# Patient Record
Sex: Male | Born: 1950 | Race: White | Hispanic: No | Smoking: Current every day smoker
Health system: Southern US, Community
[De-identification: ages and names within clinical notes are randomized; demographics above are authoritative.]

## PROBLEM LIST (undated history)

## (undated) DIAGNOSIS — I639 Cerebral infarction, unspecified: Secondary | ICD-10-CM

## (undated) DIAGNOSIS — R7303 Prediabetes: Secondary | ICD-10-CM

## (undated) DIAGNOSIS — I10 Essential (primary) hypertension: Secondary | ICD-10-CM

## (undated) DIAGNOSIS — E041 Nontoxic single thyroid nodule: Secondary | ICD-10-CM

## (undated) DIAGNOSIS — J449 Chronic obstructive pulmonary disease, unspecified: Secondary | ICD-10-CM

## (undated) DIAGNOSIS — I5189 Other ill-defined heart diseases: Secondary | ICD-10-CM

## (undated) HISTORY — DX: Cerebral infarction, unspecified: I63.9

## (undated) HISTORY — DX: Nontoxic single thyroid nodule: E04.1

## (undated) HISTORY — PX: NOSE SURGERY: SHX723

## (undated) HISTORY — PX: HAND SURGERY: SHX662

## (undated) HISTORY — PX: VASECTOMY: SHX75

## (undated) HISTORY — PX: APPENDECTOMY: SHX54

## (undated) HISTORY — DX: Essential (primary) hypertension: I10

## (undated) HISTORY — DX: Chronic obstructive pulmonary disease, unspecified: J44.9

## (undated) HISTORY — DX: Prediabetes: R73.03

## (undated) HISTORY — DX: Other ill-defined heart diseases: I51.89

---

## 2010-06-21 ENCOUNTER — Inpatient Hospital Stay (INDEPENDENT_AMBULATORY_CARE_PROVIDER_SITE_OTHER)
Admission: RE | Admit: 2010-06-21 | Discharge: 2010-06-21 | Disposition: A | Discharge status: Home / Self Care | Payer: PRIVATE HEALTH INSURANCE | Source: Ambulatory Visit | Attending: Family Medicine | Admitting: Family Medicine

## 2010-06-21 ENCOUNTER — Encounter: Payer: Self-pay | Admitting: Family Medicine

## 2010-06-21 DIAGNOSIS — B029 Zoster without complications: Secondary | ICD-10-CM

## 2010-06-23 ENCOUNTER — Telehealth (INDEPENDENT_AMBULATORY_CARE_PROVIDER_SITE_OTHER): Payer: Self-pay | Admitting: Emergency Medicine

## 2011-02-10 NOTE — Telephone Encounter (Signed)
  Phone Note Outgoing Call Call back at [facility] Phone [phone] [facility]     Call placed by: Emilio Math,  June 23, 2010 2:49 PM Call placed to: Patient Summary of Call: Still blistered, not much change, pain has improved.

## 2011-02-10 NOTE — Progress Notes (Signed)
Summary: POSSIBLE INSECT BITE? Room 5   Vital Signs:  Patient Profile:   60 Years Old Male CC:      Painful, red rash on left fingers, under left arm x 3 days Height:     66 inches Weight:      137 pounds O2 Sat:      100 % O2 treatment:    Room Air Temp:     97.4 degrees F oral Pulse rate:   65 / minute Pulse rhythm:   regular Resp:     16 per minute BP sitting:   134 / 76  (left arm) Cuff size:   regular  Vitals Entered By: Emilio Math (June 21, 2010 9:05 AM)                  Current Allergies: No known allergies History of Present Illness Chief Complaint: Painful, red rash on left fingers, under left arm x 3 days History of Present Illness:  Subjective:  Patient complains of onset of a painful rash on the tip of his left second finger 3 days ago, followed by several smaller lesions on left upper arm.  His whole arm has been somewhat sore, and he has now noticed a tender "knot" in his left axilla.  He had chills last night.  No cat or tick bites.  He had chicken pox as a child.  REVIEW OF SYSTEMS Constitutional Symptoms      Denies fever, chills, night sweats, weight loss, weight gain, and fatigue.  Eyes       Denies change in vision, eye pain, eye discharge, glasses, contact lenses, and eye surgery. Ear/Nose/Throat/Mouth       Denies hearing loss/aids, change in hearing, ear pain, ear discharge, dizziness, frequent runny nose, frequent nose bleeds, sinus problems, sore throat, hoarseness, and tooth pain or bleeding.  Respiratory       Denies dry cough, productive cough, wheezing, shortness of breath, asthma, bronchitis, and emphysema/COPD.  Cardiovascular       Denies murmurs, chest pain, and tires easily with exhertion.    Gastrointestinal       Denies stomach pain, nausea/vomiting, diarrhea, constipation, blood in bowel movements, and indigestion. Genitourniary       Denies painful urination, kidney stones, and loss of urinary control. Neurological  Denies paralysis, seizures, and fainting/blackouts. Musculoskeletal       Denies muscle pain, joint pain, joint stiffness, decreased range of motion, redness, swelling, muscle weakness, and gout.  Skin       Denies bruising, unusual mles/lumps or sores, and hair/skin or nail changes.  Psych       Denies mood changes, temper/anger issues, anxiety/stress, speech problems, depression, and sleep problems.  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  No swelling or tenderness.  In the web space at base of second finger is a small patch of erythema.  On the left upper arm over biceps area are several small 1cm crops of erythematous herpetiform lesions.  In the left axilla is a tender 2cm dia  axillary node. Eyes:  Pupils are equal, round, and reactive to light and  accomdation.  Extraocular movement is intact.  Conjunctivae are not inflamed.  Mouth:  No lesions Neck:  Supple.  No adenopathy is present.   Lungs:  Clear to auscultation.  Breath sounds are equal.  Heart:  Regular rate and rhythm without murmurs, rubs, or gallops.  Abdomen:  Nontender without masses or hepatosplenomegaly.  Bowel sounds are present.  No CVA or flank tenderness.  Assessment New Problems: HERPES ZOSTER (ICD-053.9)   Plan New Medications/Changes: CEPHALEXIN 500 MG CAPS (CEPHALEXIN) One by mouth two times a day  #14 x 0, 06/21/2010, Donna Christen MD VALTREX 1 GM TABS (VALACYCLOVIR HCL) One by mouth q8hr for shingles  #21 x 0, 06/21/2010, Donna Christen MD  New Orders: New Patient Level III [phone] Planning Comments:   Begin Valtrex.  Will  empirically add Keflex also to cover possibilty of a staph infection. Recommend Tylenol for pain and chills. Follow-up with PCP if not improving one week.  Given a Water quality scientist patient information and instruction sheet on topic   The patient and/or caregiver has been counseled thoroughly with regard to medications prescribed including dosage, schedule, interactions, rationale for use, and possible side effects and they verbalize understanding.  Diagnoses and expected course of recovery discussed and will return if not improved as expected or if the condition worsens. Patient and/or caregiver verbalized understanding.  Prescriptions: CEPHALEXIN 500 MG CAPS (CEPHALEXIN) One by mouth two times a day  #14 x 0   Entered and Authorized by:   Donna Christen MD   Signed by:   Donna Christen MD on 06/21/2010   Method used:   Print then Give to Patient   RxID:   [id number] VALTREX 1 GM TABS (VALACYCLOVIR HCL) One by mouth q8hr for shingles  #21 x 0   Entered and Authorized by:   Donna Christen MD   Signed by:   Donna Christen MD on 06/21/2010   Method used:   Print then Give to Patient   RxID:   [id number]   Orders Added: 1)  New Patient Level III [29528]

## 2015-03-27 ENCOUNTER — Ambulatory Visit (INDEPENDENT_AMBULATORY_CARE_PROVIDER_SITE_OTHER): Payer: PRIVATE HEALTH INSURANCE | Admitting: Family Medicine

## 2015-03-27 ENCOUNTER — Encounter: Payer: Self-pay | Admitting: Family Medicine

## 2015-03-27 VITALS — BP 162/79 | HR 64 | Ht 66.0 in | Wt 168.0 lb

## 2015-03-27 DIAGNOSIS — F17200 Nicotine dependence, unspecified, uncomplicated: Secondary | ICD-10-CM

## 2015-03-27 DIAGNOSIS — Z8673 Personal history of transient ischemic attack (TIA), and cerebral infarction without residual deficits: Secondary | ICD-10-CM | POA: Insufficient documentation

## 2015-03-27 DIAGNOSIS — I10 Essential (primary) hypertension: Secondary | ICD-10-CM | POA: Diagnosis not present

## 2015-03-27 DIAGNOSIS — Z23 Encounter for immunization: Secondary | ICD-10-CM

## 2015-03-27 DIAGNOSIS — I63512 Cerebral infarction due to unspecified occlusion or stenosis of left middle cerebral artery: Secondary | ICD-10-CM | POA: Diagnosis not present

## 2015-03-27 DIAGNOSIS — I639 Cerebral infarction, unspecified: Secondary | ICD-10-CM

## 2015-03-27 DIAGNOSIS — I452 Bifascicular block: Secondary | ICD-10-CM

## 2015-03-27 DIAGNOSIS — Z720 Tobacco use: Secondary | ICD-10-CM

## 2015-03-27 HISTORY — DX: Essential (primary) hypertension: I10

## 2015-03-27 MED ORDER — LISINOPRIL 10 MG PO TABS: 10.0000 mg | ORAL_TABLET | Freq: Every day | ORAL | Status: DC

## 2015-03-27 MED ORDER — ATORVASTATIN CALCIUM 40 MG PO TABS: 40.0000 mg | ORAL_TABLET | Freq: Every day | ORAL | Status: DC

## 2015-03-27 NOTE — Assessment & Plan Note (Addendum)
Likely chronic. Strart lisinopril today. RTC Monday.  He will likely require 3 blood pressure medications. We'll start lisinopril at medium dose today. Avoid dropping blood pressure too quickly due to possible stroke. Allow permissive hypertension initially.

## 2015-03-27 NOTE — Assessment & Plan Note (Addendum)
Likely given persistence of facial droop and dysarthria over 24 hours. Discussed with the patient at length the need for evaluation in the hospital. Patient defers due to cost and voiced understanding of the potential consequences of not being cared for in the acute setting. Discussed at length concerning symptoms that would prompt immediate report to the ER including any new neurological deficits or chest pain not relieved within 5 minutes of rest. Ordered CT head, MRI head, MRA, Echo, labs and referred to neurology and cardiology. Return to care Monday. Will start medication for BP and statin as well as daily ASA.

## 2015-03-27 NOTE — Assessment & Plan Note (Signed)
Unknown length given no prior EKG. Will refer to cardiology for evaluation.

## 2015-03-27 NOTE — Assessment & Plan Note (Signed)
Discussed at length need to stop smoking. Will re-evaluate and continually address going forward.

## 2015-03-27 NOTE — Progress Notes (Addendum)
Jeremy Miller is a 65 y.o. male who presents to [place]: [facility] today for difficulty speaking, gait disturbance and facial droop.  Patient noticed on Wednesday (6 days ago) difficulty with his balance. He had to use wider steps to catch his balance after continuing to fall towards the right side. Patient continued to have these symptoms until they resolved on Saturday. On Thursday, patient noticed intermittent dysarthria and word finding difficulty what has been intermittent but daily 4-5 times until today when it last happened this morning. Patient also noted on Wednesday 30 minutes of paresthesia and numbness on the Left dorsal arm. Patient came down to stay with his partner who is a patient here who noticed a R facial droop that has persisted. The remainder of his symptoms have resolved. He feels well but is concerned that he may be developing ALS like his sister did or having a stroke. He would like to avoid the ED if possible.   Patient also notes 4-5 episodes of chest pain yearly. He notes this is not exertional, though he rarely walks up stairs or walks for more than 5-10 minutes at a time. He notes he takes a single 81mg  ASA each time and feels better without resting. Patient notes the pain has never been severe enough to need rest or seek help.   Patient has not seen a doctor for health maintenance in decades. He presents here due to frustration with health care in [place] in the care for his sister who died after 5 years of a neurological degenerative disorder thought to be ALS and his mother who had many TIAs and died from a hemorrhagic stroke. Patient would like to establish care here as his partner lives in town.    History reviewed. No pertinent past medical history. Past Surgical History  Procedure Laterality Date  . Appendectomy    . Vasectomy    . Hand surgery    . Nose  surgery     Social History  Substance Use Topics  . Smoking status: Current Every Day Smoker  . Smokeless tobacco: Not on file  . Alcohol Use: No   family history includes ALS in his sister; Diabetes in his mother; Hyperlipidemia in his mother; Hypertension in his mother; Stroke in his sister.  ROS as above Medications: Current Outpatient Prescriptions  Medication Sig Dispense Refill  . aspirin 81 MG tablet Take 81 mg by mouth 2 (two) times daily.    Marland Kitchen atorvastatin (LIPITOR) 40 MG tablet Take 1 tablet (40 mg total) by mouth daily. 30 tablet 0  . lisinopril (PRINIVIL,ZESTRIL) 10 MG tablet Take 1 tablet (10 mg total) by mouth daily. 30 tablet 0   No current facility-administered medications for this visit.   No Known Allergies   Exam:  BP 162/79 mmHg  Pulse 64  Ht 5\' 6"  (1.676 m)  Wt 168 lb (76.204 kg)  BMI 27.13 kg/m2 Gen: Well NAD Non-toxic appearing.  HEENT: EOMI,  MMM, Lungs: Normal work of breathing. CTABL Heart: RRR no MRG Abd: NABS, Soft. Nondistended, Nontender Exts: Brisk capillary refill, warm and well perfused.  Neuro: CN II-XII intact to detailed examination, with the exception of R facial droop of eyebrow and angle of lip Sensation to light touch intact x 4 extremities Full strength exam 5/5 b/l UE/LE  DTR: 2+ biceps, triceps, brachioradialis, patellar and achilles  Cerebellar testing: benign to FTN, HTS Gait testing: Benign to normal gait, toe to heel,  tip toes and heel walking    No results found for this or any previous visit (from the past 24 hour(s)). No results found.  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  60 minutes spent with this  patient more than 50% was spent face-to-face Counseling possible CVA

## 2015-03-27 NOTE — Addendum Note (Signed)
Addended by: Gregor Hams on: 03/27/2015 04:48 PM   Modules accepted: Orders

## 2015-03-27 NOTE — Patient Instructions (Addendum)
Thank you for coming in today. You were seen today to establish care. We believe you have had a stroke. We believe the best place to be evaluated for this would be the hospital. We need to perform a number of tests to look for the cause and try to prevent future strokes. You will be called for all of these appointments.  Your blood pressure is high, please start taking medication. Your cholesterol is very likely to be high, and yould start taking a statin medication for that today.   Please take 325mg  of aspirin (4 of the 81mg  or 1 of the 325mg  pill). Please return for labs tomorrow morning with nothing to eat or drink except for water after midnight.  Please come into the hospital if you have any neurological symptoms or have chest pain. Return to care Monday.

## 2015-03-28 LAB — COMPREHENSIVE METABOLIC PANEL
ALBUMIN: 4.1 g/dL (ref 3.6–5.1)
ALT: 15 U/L (ref 9–46)
AST: 17 U/L (ref 10–35)
Alkaline Phosphatase: 104 U/L (ref 40–115)
BUN: 9 mg/dL (ref 7–25)
CHLORIDE: 101 mmol/L (ref 98–110)
CO2: 27 mmol/L (ref 20–31)
Calcium: 9.7 mg/dL (ref 8.6–10.3)
Creat: 0.9 mg/dL (ref 0.70–1.25)
Glucose, Bld: 92 mg/dL (ref 65–99)
POTASSIUM: 5.3 mmol/L (ref 3.5–5.3)
Sodium: 135 mmol/L (ref 135–146)
TOTAL PROTEIN: 6.8 g/dL (ref 6.1–8.1)
Total Bilirubin: 0.9 mg/dL (ref 0.2–1.2)

## 2015-03-28 LAB — CBC
HCT: 49.2 % (ref 39.0–52.0)
Hemoglobin: 16.7 g/dL (ref 13.0–17.0)
MCH: 30.3 pg (ref 26.0–34.0)
MCHC: 33.9 g/dL (ref 30.0–36.0)
MCV: 89.3 fL (ref 78.0–100.0)
MPV: 9 fL (ref 8.6–12.4)
PLATELETS: 375 10*3/uL (ref 150–400)
RBC: 5.51 MIL/uL (ref 4.22–5.81)
RDW: 13.5 % (ref 11.5–15.5)
WBC: 8.1 10*3/uL (ref 4.0–10.5)

## 2015-03-28 LAB — LIPID PANEL
CHOL/HDL RATIO: 4.3 ratio (ref <=5.0)
CHOLESTEROL: 181 mg/dL (ref 125–200)
HDL: 42 mg/dL (ref >=40)
LDL Cholesterol: 109 mg/dL (ref <130)
TRIGLYCERIDES: 148 mg/dL (ref <150)
VLDL: 30 mg/dL (ref <30)

## 2015-03-28 LAB — TSH: TSH: 0.707 u[IU]/mL (ref 0.350–4.500)

## 2015-03-29 ENCOUNTER — Telehealth: Payer: Self-pay

## 2015-03-29 ENCOUNTER — Encounter: Payer: Self-pay | Admitting: Family Medicine

## 2015-03-29 ENCOUNTER — Telehealth: Payer: Self-pay | Admitting: Family Medicine

## 2015-03-29 DIAGNOSIS — R7303 Prediabetes: Secondary | ICD-10-CM

## 2015-03-29 HISTORY — DX: Prediabetes: R73.03

## 2015-03-29 LAB — HEMOGLOBIN A1C
Hgb A1c MFr Bld: 5.8 % — ABNORMAL HIGH (ref <5.7)
Mean Plasma Glucose: 120 mg/dL — ABNORMAL HIGH (ref <117)

## 2015-03-29 NOTE — Telephone Encounter (Signed)
Pt was seen by Dr Lynne Leader at  [place] . The patient's wife called inquiring about the cost of the variety of test that was ordered by Dr Amalia Hailey . She expressed that her husbands  insurance would not  pay hardly anything towards the testing. Patient's wife  requested an estimated total  of the procedures ( which are the following ) cartiod,echo ,holter.ct of the head w/o contrast,mra of the head/brain and mr of the brain . She wanted to know if they were able to pay cash would a discount be provided. I expressed my understanding  to her concerns and assured her that I will look into this matter and give her call back with 24 hours

## 2015-03-29 NOTE — Progress Notes (Signed)
Quick Note:  Labs show mild prediabetes. Other labs are OK. Continue current plan. ______

## 2015-03-29 NOTE — Telephone Encounter (Signed)
Called [employer], spoke with Henry. Was advised Pt's insurance plan does not cover outpatient imaging. Called the [facility] [place] network [phone]) to find a location that is participating with the plan to have a lower cost. [facility] ([address]) is participating. Will route to PCP to discuss with Pt about imaging and what the next step for plan of care is regarding Pt's expected OOP cost.

## 2015-03-29 NOTE — Telephone Encounter (Signed)
SPOKE WITH PATIENT  WIFE  WITH PATIENT'S PERMISSION A SECOND TIME. I WAS ABLE TO GET THE CPT CODES AND THE ESTIMATED COST OF THE PROCEDURES FOR PATIENT UPCOMING TEST . I WILL MAIL THIS INFORMATION TO THE PATIENT TODAY ALONG WITH THE [facility] [employer] APPLICATION.

## 2015-04-02 ENCOUNTER — Ambulatory Visit (INDEPENDENT_AMBULATORY_CARE_PROVIDER_SITE_OTHER): Payer: PRIVATE HEALTH INSURANCE | Admitting: Family Medicine

## 2015-04-02 ENCOUNTER — Encounter: Payer: Self-pay | Admitting: Family Medicine

## 2015-04-02 VITALS — BP 144/80 | HR 72 | Wt 167.0 lb

## 2015-04-02 DIAGNOSIS — I63512 Cerebral infarction due to unspecified occlusion or stenosis of left middle cerebral artery: Secondary | ICD-10-CM | POA: Diagnosis not present

## 2015-04-02 DIAGNOSIS — R079 Chest pain, unspecified: Secondary | ICD-10-CM

## 2015-04-02 DIAGNOSIS — I10 Essential (primary) hypertension: Secondary | ICD-10-CM

## 2015-04-02 LAB — COMPREHENSIVE METABOLIC PANEL
ALBUMIN: 4.5 g/dL (ref 3.6–5.1)
ALK PHOS: 99 U/L (ref 40–115)
ALT: 15 U/L (ref 9–46)
AST: 17 U/L (ref 10–35)
BILIRUBIN TOTAL: 0.7 mg/dL (ref 0.2–1.2)
BUN: 8 mg/dL (ref 7–25)
CALCIUM: 9.6 mg/dL (ref 8.6–10.3)
CO2: 27 mmol/L (ref 20–31)
CREATININE: 0.91 mg/dL (ref 0.70–1.25)
Chloride: 100 mmol/L (ref 98–110)
Glucose, Bld: 101 mg/dL — ABNORMAL HIGH (ref 65–99)
Potassium: 4.6 mmol/L (ref 3.5–5.3)
SODIUM: 137 mmol/L (ref 135–146)
TOTAL PROTEIN: 6.9 g/dL (ref 6.1–8.1)

## 2015-04-02 MED ORDER — NITROGLYCERIN 0.4 MG SL SUBL: 0.4000 mg | SUBLINGUAL_TABLET | SUBLINGUAL | Status: DC | PRN

## 2015-04-02 MED ORDER — LISINOPRIL-HYDROCHLOROTHIAZIDE 10-12.5 MG PO TABS: 1.0000 | ORAL_TABLET | Freq: Every day | ORAL | Status: DC

## 2015-04-02 NOTE — Assessment & Plan Note (Signed)
Improving. Increase to lisinopril/hydrochlorothiazide 10/12.5. Recheck in 3 weeks. Repeat CMP.

## 2015-04-02 NOTE — Patient Instructions (Addendum)
Thank you for coming in today. Return in 3 weeks.  Refill any medicines you need.  Take nitroglycerin up to 3 times as needed for chest pain. Call or go to the emergency room if you get worse, have trouble breathing, have chest pains, or palpitations.

## 2015-04-02 NOTE — Assessment & Plan Note (Signed)
Subjectively improving. Plan to obtain neuroimaging test, carotid Doppler, echocardiogram. Return in 3 weeks. Follow-up with cardiology and neurology.

## 2015-04-02 NOTE — Progress Notes (Signed)
       Jeremy Miller is a 65 y.o. male who presents to [place]: [facility] today for follow-up stroke. Patient was seen last week for new patient visit or he was thought to have had a stroke about a week prior. He was started on lisinopril atorvastatin and aspirin 325. In the interval he has done well. His facial droop is improving. He denies any significant fevers chills nausea vomiting or diarrhea. He does note brief intermittent nonexertional chest pain and dyspnea. This has been ongoing now for months. He is an appointment upcoming with both cardiology and neurology. He has neuro imaging upcoming this week as well. His symptoms are improving or stable. He has cut back on smoking as well. He tolerates his medicines well.   No past medical history on file. Past Surgical History  Procedure Laterality Date  . Appendectomy    . Vasectomy    . Hand surgery    . Nose surgery     Social History  Substance Use Topics  . Smoking status: Current Every Day Smoker  . Smokeless tobacco: Not on file  . Alcohol Use: No   family history includes ALS in his sister; Diabetes in his mother; Hyperlipidemia in his mother; Hypertension in his mother; Stroke in his sister.  ROS as above Medications: Current Outpatient Prescriptions  Medication Sig Dispense Refill  . aspirin 325 MG tablet Take 325 mg by mouth daily.    Marland Kitchen atorvastatin (LIPITOR) 40 MG tablet Take 1 tablet (40 mg total) by mouth daily. 30 tablet 0  . lisinopril-hydrochlorothiazide (PRINZIDE,ZESTORETIC) 10-12.5 MG tablet Take 1 tablet by mouth daily. 30 tablet 0  . nitroGLYCERIN (NITROSTAT) 0.4 MG SL tablet Place 1 tablet (0.4 mg total) under the tongue every 5 (five) minutes as needed for chest pain. 30 tablet 1   No current facility-administered medications for this visit.   No Known Allergies   Exam:  BP 144/80 mmHg  Pulse 72  Wt 167 lb  (75.751 kg) Gen: Well NAD nontoxic appearing HEENT: EOMI,  MMM Lungs: Normal work of breathing. CTABL Heart: RRR no MRG Abd: NABS, Soft. Nondistended, Nontender Exts: Brisk capillary refill, warm and well perfused.  CN II-XII intact to detailed examination, with the exception of R facial droop of eyebrow and angle of lip Sensation to light touch intact x 4 extremities Full strength exam 5/5 b/l UE/LE  DTR: 2+ biceps, triceps, brachioradialis, patellar and achilles  Cerebellar testing: benign to FTN, HTS Gait testing: Benign to normal gait, toe to heel, tip toes and heel walking   No results found for this or any previous visit (from the past 24 hour(s)). No results found.   Please see individual assessment and plan sections.

## 2015-04-02 NOTE — Assessment & Plan Note (Signed)
Appointment scheduled with cardiology. Prescribed nitroglycerin for use as needed. Discussed warning signs or symptoms. Continue risk factor modification with statin aspirin ACE inhibitor and reducing or smoking cessation. Return in 3 weeks.

## 2015-04-03 ENCOUNTER — Telehealth: Payer: Self-pay | Admitting: Family Medicine

## 2015-04-03 NOTE — Telephone Encounter (Signed)
Called pt. Advised him of results.

## 2015-04-03 NOTE — Progress Notes (Signed)
Quick Note:  Labs look ok ______

## 2015-04-03 NOTE — Telephone Encounter (Signed)
Patient called adv that Dr. Georgina Snell called him and he was returning his call he did not state what the call was in reference to. Thanks

## 2015-04-04 ENCOUNTER — Ambulatory Visit (INDEPENDENT_AMBULATORY_CARE_PROVIDER_SITE_OTHER): Payer: PRIVATE HEALTH INSURANCE | Admitting: Cardiology

## 2015-04-04 ENCOUNTER — Encounter: Payer: Self-pay | Admitting: Cardiology

## 2015-04-04 VITALS — BP 114/72 | HR 65 | Ht 66.0 in | Wt 167.0 lb

## 2015-04-04 DIAGNOSIS — I452 Bifascicular block: Secondary | ICD-10-CM | POA: Diagnosis not present

## 2015-04-04 NOTE — Patient Instructions (Signed)
Medication Instructions:  NONE  Labwork: NONE  Testing/Procedures: Your physician has requested that you have en exercise stress myoview. For further information please visit [email or web address]. Please follow instruction sheet, as given. Your physician has recommended that you wear an event monitor. Event monitors are medical devices that record the heart's electrical activity. Doctors most often [place] these monitors to diagnose arrhythmias. Arrhythmias are problems with the speed or rhythm of the heartbeat. The monitor is a small, portable device. You can wear one while you do your normal daily activities. This is usually used to diagnose what is causing palpitations/syncope (passing out). CHANGE  HOLTER  TO 30 DAY  MONITOR  Follow-Up: Your physician recommends that you schedule a follow-up appointment in: [place]   Any Other Special Instructions Will Be Listed Below (If Applicable).     If you need a refill on your cardiac medications before your next appointment, please call your pharmacy.

## 2015-04-04 NOTE — Progress Notes (Signed)
04/04/2015 Jeremy Miller   1950/09/10  [id number]  Primary Physician Lynne Leader, MD Primary Cardiologist: New (disscused plan with Dr. Mare Ferrari)  Reason for Visit/CC: PCP Referral for Abnormal EKG (Bifacicilar Block)  HPI:  The patient is a 65 y/o male, who presents to clinic as a new patient. He was referred by his PCP, Dr. Georgina Snell of [place] in [place], primarily for an abnormal EKG concerning for bifacicular block. The patient has no known past cardiac history. His family h/o is notable for CAD. His father had multiple MIs but this occurred later in life, in his 53s. His mother had multiple strokes. His PMH is significant for tobacco abuse, HTN and pre-diabetes. Per notes, he has gone years w/o routien f/u or preventative care. He presented to the [place] clinic for the first time on 03/27/15 with symptoms concerning for a stroke. He had a 3 day h/o difficulty speaking, gait disturbance and facial droop. He had complete resolution of his symptoms spontaneously. By the time he was seen in clinic, he had no recurrent symptoms. He was placed on high dose ASA and a statin, as well as lisinopril/HCTZ for BP. FLP showed controlled cholesterol levels. 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 Patient also noted h/o occasional chest discomfort, relieved with ASA. The patient was advised to f/u with our practice as well as neurology.    Today in clinic, he reports that he has done well over the past week. He denies any recurrent neurological deficits. All previous symptoms have resolved. 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 HR is 65 bpm. He denies h/o syncope/ near syncope. No palpitations but he did feel dizzy around the time of his stroke. He notes occasional chest pain in the past but no recent pain. He has noticed increased dyspnea with light exertion over the past 2 weeks. He has difficulty carrying bags of groceries from his car to his house. No resting dyspnea. No  orthopnea, PND or LED.  His BP is well controlled at 114/72.     Current Outpatient Prescriptions  Medication Sig Dispense Refill  . aspirin 325 MG tablet Take 325 mg by mouth daily.    Marland Kitchen atorvastatin (LIPITOR) 40 MG tablet Take 1 tablet (40 mg total) by mouth daily. 30 tablet 0  . lisinopril-hydrochlorothiazide (PRINZIDE,ZESTORETIC) 10-12.5 MG tablet Take 1 tablet by mouth daily. 30 tablet 0  . nitroGLYCERIN (NITROSTAT) 0.4 MG SL tablet Place 0.4 mg under the tongue every 5 (five) minutes as needed for chest pain (x 3 pills).     No current facility-administered medications for this visit.    No Known Allergies  Social History   Social History  . Marital Status: Divorced    Spouse Name: N/A  . Number of Children: N/A  . Years of Education: N/A   Occupational History  . Not on file.   Social History Main Topics  . Smoking status: Current Every Day Smoker  . Smokeless tobacco: Not on file  . Alcohol Use: No  . Drug Use: No  . Sexual Activity: Not on file   Other Topics Concern  . Not on file   Social History Narrative     Review of Systems: General: negative for chills, fever, night sweats or weight changes.  Cardiovascular: negative for chest pain, dyspnea on exertion, edema, orthopnea, palpitations, paroxysmal nocturnal dyspnea or shortness of breath Dermatological: negative for rash Respiratory: negative for cough or wheezing Urologic: negative for hematuria Abdominal: negative for nausea, vomiting,  diarrhea, bright red blood per rectum, melena, or hematemesis Neurologic: negative for visual changes, syncope, or dizziness All other systems reviewed and are otherwise negative except as noted above.    Blood pressure 114/72, pulse 65, height 5\' 6"  (1.676 m), weight 167 lb (75.751 kg).  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 Abnormal EKG (Bifascicular Block): patient denies h/o syncope/ near syncope. He has had CP in the past relieved with ASA and has recently noticed exertional dyspnea with mild activity. His risk factors for CAD include tobacco use and HTN. We will arrange for an exercise myoview to r/o ischemia. We will also obtain a 2D echo to assess for any structural heart disease. Will also evaluate with a cardiac monitor. His PCP ordered a 24 hr monitor, however in light of his recent CVA, we will increase duration of monitoring to 30 days, as we will need to assess for atrial fibrillation/flutter.  2. CVA: patient symptoms have resolved. He is on ASA, a statin and lisinopril for BP. Recent lipid panel 03/27/15 showed controlled cholesterol levels. Neck exam is negative for carotid bruits. He has f/u with neurology in several weeks. He denies any h/o palpitations. We will assess with a 30 day monitor to r/o atrial fibrillation.   3. HTN: followed by PCP. BP is well controlled on lisinopril/HCTZ.  4. Tobacco Abuse: smoking cessation strongly advised.    PLAN  F/u in 4 weeks.   Note: I discussed patient case with Dr. Mare Ferrari, DOD. He was involved in the decision making and agrees with the care plan outlined above.   Lyda Jester PA-C 04/04/2015 8:52 AM

## 2015-04-06 ENCOUNTER — Ambulatory Visit (HOSPITAL_COMMUNITY): Payer: PRIVATE HEALTH INSURANCE | Attending: Internal Medicine

## 2015-04-06 ENCOUNTER — Other Ambulatory Visit: Payer: Self-pay

## 2015-04-06 ENCOUNTER — Ambulatory Visit (INDEPENDENT_AMBULATORY_CARE_PROVIDER_SITE_OTHER): Payer: PRIVATE HEALTH INSURANCE

## 2015-04-06 DIAGNOSIS — I63512 Cerebral infarction due to unspecified occlusion or stenosis of left middle cerebral artery: Secondary | ICD-10-CM | POA: Diagnosis not present

## 2015-04-06 DIAGNOSIS — I517 Cardiomegaly: Secondary | ICD-10-CM | POA: Insufficient documentation

## 2015-04-06 DIAGNOSIS — I10 Essential (primary) hypertension: Secondary | ICD-10-CM

## 2015-04-06 DIAGNOSIS — I452 Bifascicular block: Secondary | ICD-10-CM | POA: Diagnosis not present

## 2015-04-09 ENCOUNTER — Encounter: Payer: Self-pay | Admitting: Family Medicine

## 2015-04-09 DIAGNOSIS — I119 Hypertensive heart disease without heart failure: Secondary | ICD-10-CM | POA: Insufficient documentation

## 2015-04-09 DIAGNOSIS — I5189 Other ill-defined heart diseases: Secondary | ICD-10-CM | POA: Insufficient documentation

## 2015-04-09 HISTORY — DX: Other ill-defined heart diseases: I51.89

## 2015-04-11 ENCOUNTER — Telehealth (HOSPITAL_COMMUNITY): Payer: Self-pay | Admitting: *Deleted

## 2015-04-11 NOTE — Telephone Encounter (Signed)
Patient given detailed instructions per Myocardial Perfusion Study Information Sheet for the test on 04/13/15 at 845. Patient notified to arrive 15 minutes early and that it is imperative to arrive on time for appointment to keep from having the test rescheduled.  If you need to cancel or reschedule your appointment, please call the office within 24 hours of your appointment. Failure to do so may result in a cancellation of your appointment, and a $50 no show fee. Patient verbalized understanding.Hubbard Robinson, RN

## 2015-04-13 ENCOUNTER — Ambulatory Visit (HOSPITAL_COMMUNITY): Payer: PRIVATE HEALTH INSURANCE | Attending: Cardiology

## 2015-04-13 DIAGNOSIS — Z8249 Family history of ischemic heart disease and other diseases of the circulatory system: Secondary | ICD-10-CM | POA: Insufficient documentation

## 2015-04-13 DIAGNOSIS — I452 Bifascicular block: Secondary | ICD-10-CM | POA: Insufficient documentation

## 2015-04-13 DIAGNOSIS — F17200 Nicotine dependence, unspecified, uncomplicated: Secondary | ICD-10-CM | POA: Insufficient documentation

## 2015-04-13 DIAGNOSIS — R079 Chest pain, unspecified: Secondary | ICD-10-CM | POA: Insufficient documentation

## 2015-04-13 DIAGNOSIS — R0609 Other forms of dyspnea: Secondary | ICD-10-CM | POA: Insufficient documentation

## 2015-04-13 LAB — MYOCARDIAL PERFUSION IMAGING
CHL CUP MPHR: 156 {beats}/min
CHL CUP NUCLEAR SDS: 2
CHL CUP NUCLEAR SRS: 1
CHL CUP NUCLEAR SSS: 3
CHL CUP RESTING HR STRESS: 80 {beats}/min
CSEPEDS: 0 s
Estimated workload: 7 METS
Exercise duration (min): 5 min
LV sys vol: 41 mL
LVDIAVOL: 89 mL
Peak HR: 151 {beats}/min
Percent HR: 96 %
RATE: 0.27
TID: 1.09

## 2015-04-13 MED ORDER — TECHNETIUM TC 99M SESTAMIBI GENERIC - CARDIOLITE
32.0000 | Freq: Once | INTRAVENOUS | Status: AC | PRN
  Administered 2015-04-13: 32 via INTRAVENOUS

## 2015-04-13 MED ORDER — TECHNETIUM TC 99M SESTAMIBI GENERIC - CARDIOLITE
10.8000 | Freq: Once | INTRAVENOUS | Status: AC | PRN
  Administered 2015-04-13: 11 via INTRAVENOUS

## 2015-04-18 ENCOUNTER — Telehealth: Payer: Self-pay | Admitting: Cardiology

## 2015-04-18 NOTE — Telephone Encounter (Deleted)
Pt calling re heart monitor-wants to send it back-it keeps breaking and feels there should be enough data by now-pls call

## 2015-04-18 NOTE — Telephone Encounter (Signed)
Returned pt's call.  Pt advised that the holder on the monitor kept breaking,, he was having a hard time sleeping with the monitor on, and that pt stated that he has talked to several people and that they have never heard of anyone wearing a 30 day event monitor and he wasn't sure why [place] ordered it.  I spoke with Ellen Henri, PA-C, and she advised that the pt wear the monitor the 30 days to see if we can catch pt's afib.  Pt was explained the importance of following the orders, but of course, he was the pt and it was his decision, but he came in with some symptoms and were trying to find the problem.  Pt just kept on saying, "Well I'm not sure I can promise you the whole 30 days:".  Pt was again advised that that was his choice.  Pt verbalized understanding.

## 2015-04-18 NOTE — Telephone Encounter (Signed)
Pt wants to talk to [place] re heart monitor-it keeps breaking and feels we should have enough data by now-pls call

## 2015-04-23 ENCOUNTER — Ambulatory Visit: Payer: PRIVATE HEALTH INSURANCE | Admitting: Physician Assistant

## 2015-04-23 ENCOUNTER — Encounter: Payer: Self-pay | Admitting: Family Medicine

## 2015-04-23 ENCOUNTER — Ambulatory Visit (INDEPENDENT_AMBULATORY_CARE_PROVIDER_SITE_OTHER): Payer: PRIVATE HEALTH INSURANCE | Admitting: Family Medicine

## 2015-04-23 VITALS — BP 126/67 | HR 78 | Wt 167.0 lb

## 2015-04-23 DIAGNOSIS — I10 Essential (primary) hypertension: Secondary | ICD-10-CM | POA: Diagnosis not present

## 2015-04-23 DIAGNOSIS — Z720 Tobacco use: Secondary | ICD-10-CM

## 2015-04-23 DIAGNOSIS — I63512 Cerebral infarction due to unspecified occlusion or stenosis of left middle cerebral artery: Secondary | ICD-10-CM

## 2015-04-23 DIAGNOSIS — F17200 Nicotine dependence, unspecified, uncomplicated: Secondary | ICD-10-CM

## 2015-04-23 MED ORDER — LISINOPRIL-HYDROCHLOROTHIAZIDE 10-12.5 MG PO TABS: 1.0000 | ORAL_TABLET | Freq: Every day | ORAL | Status: DC

## 2015-04-23 MED ORDER — ATORVASTATIN CALCIUM 40 MG PO TABS: 40.0000 mg | ORAL_TABLET | Freq: Every day | ORAL | Status: DC

## 2015-04-23 NOTE — Patient Instructions (Signed)
Thank you for coming in today. Return in 3 months.  Continue medicines.  Work on cutting back smoking.  Call or go to the emergency room if you get worse, have trouble breathing, have chest pains, or palpitations.

## 2015-04-23 NOTE — Progress Notes (Signed)
       Jeremy Miller is a 65 y.o. male who presents to [facility] today for follow-up blood pressure cholesterol and recent stroke. Patient has been taking the below medications and feels well with no side effects. He was seen by cardiology recently and had a normal stress test. He is currently wearing a 30 day event monitor. He has an appointment upcoming with neurology soon. He would like to delay neuro imaging tests until April if able due to insurance costs.   No past medical history on file. Past Surgical History  Procedure Laterality Date  . Appendectomy    . Vasectomy    . Hand surgery    . Nose surgery     Social History  Substance Use Topics  . Smoking status: Current Every Day Smoker  . Smokeless tobacco: Not on file  . Alcohol Use: No   family history includes ALS in his sister; Diabetes in his mother; Hyperlipidemia in his mother; Hypertension in his mother; Stroke in his sister.  ROS as above Medications: Current Outpatient Prescriptions  Medication Sig Dispense Refill  . aspirin 325 MG tablet Take 325 mg by mouth daily.    Marland Kitchen atorvastatin (LIPITOR) 40 MG tablet Take 1 tablet (40 mg total) by mouth daily. 90 tablet 0  . lisinopril-hydrochlorothiazide (PRINZIDE,ZESTORETIC) 10-12.5 MG tablet Take 1 tablet by mouth daily. 90 tablet 0  . nitroGLYCERIN (NITROSTAT) 0.4 MG SL tablet Place 0.4 mg under the tongue every 5 (five) minutes as needed for chest pain (x 3 pills). Reported on 04/23/2015     No current facility-administered medications for this visit.   No Known Allergies   Exam:  BP 126/67 mmHg  Pulse 78  Wt 167 lb (75.751 kg) Gen: Well NAD HEENT: EOMI,  MMM no bruit Lungs: Normal work of breathing. CTABL Heart: RRR no MRG Abd: NABS, Soft. Nondistended, Nontender Exts: Brisk capillary refill, warm and well perfused.   No results found for this or any  previous visit (from the past 24 hour(s)). No results found.   Please see individual assessment and plan sections.

## 2015-04-23 NOTE — Assessment & Plan Note (Signed)
Encourage smoking cessation. Patient is not ready to attempt to quit smoking.

## 2015-04-23 NOTE — Assessment & Plan Note (Signed)
Continue current regimen. Recheck in 3 months.

## 2015-05-04 ENCOUNTER — Ambulatory Visit (INDEPENDENT_AMBULATORY_CARE_PROVIDER_SITE_OTHER): Payer: PRIVATE HEALTH INSURANCE | Admitting: Cardiology

## 2015-05-04 ENCOUNTER — Encounter: Payer: Self-pay | Admitting: Cardiology

## 2015-05-04 VITALS — BP 120/60 | HR 88 | Ht 66.0 in | Wt 167.4 lb

## 2015-05-04 DIAGNOSIS — I452 Bifascicular block: Secondary | ICD-10-CM

## 2015-05-04 NOTE — Patient Instructions (Signed)
Medication Instructions:  Your physician recommends that you continue on your current medications as directed. Please refer to the Current Medication list given to you today.   Labwork: NONE  Testing/Procedures: NONE  Follow-Up: AS NEEDED   Any Other Special Instructions Will Be Listed Below (If Applicable).     If you need a refill on your cardiac medications before your next appointment, please call your pharmacy. 

## 2015-05-04 NOTE — Progress Notes (Signed)
05/04/2015 Jeremy Miller   [birth year]  [id number]  Primary Physician Lynne Leader, MD Primary Cardiologist: Dr. Mare Ferrari   Reason for Visit/CC: F/u for Abnormal EKG and CVA  HPI:  65 y/o male, who presents back for evaluation for abnormal EKG and CVA. He was referred by his PCP, Dr. Georgina Snell of [facility] in [place], primarily for an abnormal EKG concerning for bifacicular block. The patient has no known past cardiac history. His family h/o is notable for CAD. His father had multiple MIs but this occurred later in life, in his 50s. His mother had multiple strokes. His PMH is significant for tobacco abuse, HTN and pre-diabetes. Per notes, he has gone years w/o routien f/u or preventative care. He presented to the [facility] clinic for the first time on 03/27/15 with symptoms concerning for a stroke. He had a 3 day h/o difficulty speaking, gait disturbance and facial droop. He had complete resolution of his symptoms spontaneously. By the time he was seen in clinic, he had no recurrent symptoms. He was placed on high dose ASA and a statin, as well as lisinopril/HCTZ for BP. FLP showed controlled cholesterol levels. 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 Patient also noted h/o occasional chest discomfort, relieved with ASA. The patient was advised to f/u with our practice as well as neurology.   When I initially saw him in clinic on 04/04/15, he was doing well. He denied any recurrent neurological deficits. All previous symptoms had resolved. His EKG continued to show bifascicular block. HR was 65. He denied h/o syncope/ near syncope. No palpitations but he did feel dizzy around the time of his stroke. He noted prior history of CP but no recent CP. He did mention mild DOE, particularly with carrying bags of groceries from his car to his house. I elected to order an exercise myoview to r/o ischemia. We also obtained a 2D echo to assess for any structural heart disease as well as a 30 day  event monitor to r/o atrial fibrillation given his stroke.   He presents back for f/u.  NST was low risk and negative for ischemia. EF was 53% on nuc study. 2D echo also confirmed normal LVEF of 60-65% and G1DD. There was no mention of cardiac thrombus. No significant valve abnormalities. Heart monitor showed NSR. No atrial fibrillation or flutter.  He denies any chest pain. No recurrent neurological symptoms. No significant dyspnea. BP is well controlled. He reports full daily compliance with ASA, Lipitor, Lisinopril/HCTZ.    Current Outpatient Prescriptions  Medication Sig Dispense Refill  . aspirin 325 MG tablet Take 325 mg by mouth daily.    Marland Kitchen atorvastatin (LIPITOR) 40 MG tablet Take 1 tablet (40 mg total) by mouth daily. 90 tablet 0  . lisinopril-hydrochlorothiazide (PRINZIDE,ZESTORETIC) 10-12.5 MG tablet Take 1 tablet by mouth daily. 90 tablet 0  . nitroGLYCERIN (NITROSTAT) 0.4 MG SL tablet Place 0.4 mg under the tongue every 5 (five) minutes as needed for chest pain (x 3 pills). Reported on 04/23/2015     No current facility-administered medications for this visit.    No Known Allergies  Social History   Social History  . Marital Status: Divorced    Spouse Name: N/A  . Number of Children: N/A  . Years of Education: N/A   Occupational History  . Not on file.   Social History Main Topics  . Smoking status: Current Every Day Smoker  . Smokeless tobacco: Not on file  . Alcohol Use: No  .  Drug Use: No  . Sexual Activity: Not on file   Other Topics Concern  . Not on file   Social History Narrative     Review of Systems: General: negative for chills, fever, night sweats or weight changes.  Cardiovascular: negative for chest pain, dyspnea on exertion, edema, orthopnea, palpitations, paroxysmal nocturnal dyspnea or shortness of breath Dermatological: negative for rash Respiratory: negative for cough or wheezing Urologic: negative for hematuria Abdominal: negative for  nausea, vomiting, diarrhea, bright red blood per rectum, melena, or hematemesis Neurologic: negative for visual changes, syncope, or dizziness All other systems reviewed and are otherwise negative except as noted above.    Blood pressure 120/60, pulse 88, height 5\' 6"  (1.676 m), weight 167 lb 7.2 oz (75.955 kg).  General appearance: alert, cooperative and no distress Neck: no carotid bruit and no JVD Lungs: clear to auscultation bilaterally Heart: regular rate and rhythm, S1, S2 normal, no murmur, click, rub or gallop Extremities: no LEE Pulses: 2+ and symmetric Skin: warm and dry Neurologic: Grossly normal  EKG not performed.   ASSESSMENT AND PLAN:   1. ? CVA: no recurrent symptoms. 30 day monitor showed NSR. No afib/ atrial flutter. He has f/u with neurology scheduled for further recommendations. He is to continue ASA, Lipitor and Lisinopril/HCTZ.   2. DOE: symptoms have resolved. NST and 2D echo both normal. No ischemia. LVEF 60-65%. No significant valve abnormalities. May be 2/2 smoking. Patient encouraged to quit.   3. Bifascicular Block: normal NST and echo. No arrhthymias on heart monitor. No syncope. No further w/u indicated.   4. HTN: BP is well controlled. Managed by PCP.   5. Tobacco Abuse: smoking cessation strongly advised.   PLAN  Continue routine f/u with PCP for management of his HTN and routine preventative care. F/u with [place] as needed.   Abdulhadi Stopa PA-C 05/04/2015 9:07 AM

## 2015-06-12 ENCOUNTER — Encounter: Payer: Self-pay | Admitting: Neurology

## 2015-06-12 ENCOUNTER — Ambulatory Visit (INDEPENDENT_AMBULATORY_CARE_PROVIDER_SITE_OTHER): Payer: Medicare Other | Admitting: Neurology

## 2015-06-12 VITALS — BP 132/66 | HR 68 | Ht 66.0 in | Wt 168.0 lb

## 2015-06-12 DIAGNOSIS — I519 Heart disease, unspecified: Secondary | ICD-10-CM | POA: Diagnosis not present

## 2015-06-12 DIAGNOSIS — R7303 Prediabetes: Secondary | ICD-10-CM

## 2015-06-12 DIAGNOSIS — I63512 Cerebral infarction due to unspecified occlusion or stenosis of left middle cerebral artery: Secondary | ICD-10-CM | POA: Diagnosis not present

## 2015-06-12 DIAGNOSIS — I10 Essential (primary) hypertension: Secondary | ICD-10-CM | POA: Diagnosis not present

## 2015-06-12 DIAGNOSIS — I639 Cerebral infarction, unspecified: Secondary | ICD-10-CM | POA: Diagnosis not present

## 2015-06-12 DIAGNOSIS — F17210 Nicotine dependence, cigarettes, uncomplicated: Secondary | ICD-10-CM

## 2015-06-12 DIAGNOSIS — I5189 Other ill-defined heart diseases: Secondary | ICD-10-CM

## 2015-06-12 DIAGNOSIS — Z720 Tobacco use: Secondary | ICD-10-CM

## 2015-06-12 NOTE — Patient Instructions (Signed)
1.  Continue aspirin 325mg  daily 2.  Continue Lipitor 40mg  daily 3.  Blood pressure control 4.  Check MRI and MRA of head 5.  Check carotid doppler 6.  Recommend to quit smoking 7.  Mediterranean diet    Why follow it? Research shows. . Those who follow the Mediterranean diet have a reduced risk of heart disease  . The diet is associated with a reduced incidence of Parkinson's and Alzheimer's diseases . People following the diet may have longer life expectancies and lower rates of chronic diseases  . The Dietary Guidelines for Americans recommends the Mediterranean diet as an eating plan to promote health and prevent disease  What Is the Mediterranean Diet?  . Healthy eating plan based on typical foods and recipes of Mediterranean-style cooking . The diet is primarily a plant based diet; these foods should make up a majority of meals   Starches - Plant based foods should make up a majority of meals - They are an important sources of vitamins, minerals, energy, antioxidants, and fiber - Choose whole grains, foods high in fiber and minimally processed items  - Typical grain sources include wheat, oats, barley, corn, brown rice, bulgar, farro, millet, polenta, couscous  - Various types of beans include chickpeas, lentils, fava beans, black beans, white beans   Fruits  Veggies - Large quantities of antioxidant rich fruits & veggies; 6 or more servings  - Vegetables can be eaten raw or lightly drizzled with oil and cooked  - Vegetables common to the traditional Mediterranean Diet include: artichokes, arugula, beets, broccoli, brussel sprouts, cabbage, carrots, celery, collard greens, cucumbers, eggplant, kale, leeks, lemons, lettuce, mushrooms, okra, onions, peas, peppers, potatoes, pumpkin, radishes, rutabaga, shallots, spinach, sweet potatoes, turnips, zucchini - Fruits common to the Mediterranean Diet include: apples, apricots, avocados, cherries, clementines, dates, figs, grapefruits, grapes,  melons, nectarines, oranges, peaches, pears, pomegranates, strawberries, tangerines  Fats - Replace butter and margarine with healthy oils, such as olive oil, canola oil, and tahini  - Limit nuts to no more than a handful a day  - Nuts include walnuts, almonds, pecans, pistachios, pine nuts  - Limit or avoid candied, honey roasted or heavily salted nuts - Olives are central to the [employer] - can be eaten whole or used in a variety of dishes   Meats Protein - Limiting red meat: no more than a few times a month - When eating red meat: choose lean cuts and keep the portion to the size of deck of cards - Eggs: approx. 0 to 4 times a week  - Fish and lean poultry: at least 2 a week  - Healthy protein sources include, chicken, [place], lean beef, lamb - Increase intake of seafood such as tuna, salmon, trout, mackerel, shrimp, scallops - Avoid or limit high fat processed meats such as sausage and bacon  Dairy - Include moderate amounts of low fat dairy products  - Focus on healthy dairy such as fat free yogurt, skim milk, low or reduced fat cheese - Limit dairy products higher in fat such as whole or 2% milk, cheese, ice cream  Alcohol - Moderate amounts of red wine is ok  - No more than 5 oz daily for women (all ages) and men older than age 56  - No more than 10 oz of wine daily for men younger than 56  Other - Limit sweets and other desserts  - Use herbs and spices instead of salt to flavor foods  - Herbs and spices  common to the traditional Mediterranean Diet include: basil, bay leaves, chives, cloves, cumin, fennel, garlic, lavender, marjoram, mint, oregano, parsley, pepper, rosemary, sage, savory, sumac, tarragon, thyme   It's not just a diet, it's a lifestyle:  . The Mediterranean diet includes lifestyle factors typical of those in the region  . Foods, drinks and meals are best eaten with others and savored . Daily physical activity is important for overall good health . This could  be strenuous exercise like running and aerobics . This could also be more leisurely activities such as walking, housework, yard-work, or taking the stairs . Moderation is the key; a balanced and healthy diet accommodates most foods and drinks . Consider portion sizes and frequency of consumption of certain foods   Meal Ideas & Options:  . Breakfast:  o Whole wheat toast or whole wheat English muffins with peanut butter & hard boiled egg o Steel cut oats topped with apples & cinnamon and skim milk  o Fresh fruit: banana, strawberries, melon, berries, peaches  o Smoothies: strawberries, bananas, greek yogurt, peanut butter o Low fat greek yogurt with blueberries and granola  o Egg white omelet with spinach and mushrooms o Breakfast couscous: whole wheat couscous, apricots, skim milk, cranberries  . Sandwiches:  o Hummus and grilled vegetables (peppers, zucchini, squash) on whole wheat bread   o Grilled chicken on whole wheat pita with lettuce, tomatoes, cucumbers or tzatziki  o Tuna salad on whole wheat bread: tuna salad made with greek yogurt, olives, red peppers, capers, green onions o Garlic rosemary lamb pita: lamb sauted with garlic, rosemary, salt & pepper; add lettuce, cucumber, greek yogurt to pita - flavor with lemon juice and black pepper  . Seafood:  o Mediterranean grilled salmon, seasoned with garlic, basil, parsley, lemon juice and black pepper o Shrimp, lemon, and spinach whole-grain pasta salad made with low fat greek yogurt  o Seared scallops with lemon orzo  o Seared tuna steaks seasoned salt, pepper, coriander topped with tomato mixture of olives, tomatoes, olive oil, minced garlic, parsley, green onions and cappers  . Meats:  o Herbed greek chicken salad with kalamata olives, cucumber, feta  o Red bell peppers stuffed with spinach, bulgur, lean ground beef (or lentils) & topped with feta   o Kebabs: skewers of chicken, tomatoes, onions, zucchini, squash  o [place]  burgers: made with red onions, mint, dill, lemon juice, feta cheese topped with roasted red peppers . Vegetarian o Cucumber salad: cucumbers, artichoke hearts, celery, red onion, feta cheese, tossed in olive oil & lemon juice  o Hummus and whole grain pita points with a greek salad (lettuce, tomato, feta, olives, cucumbers, red onion) o Lentil soup with celery, carrots made with vegetable broth, garlic, salt and pepper  o Tabouli salad: parsley, bulgur, mint, scallions, cucumbers, tomato, radishes, lemon juice, olive oil, salt and pepper. Follow up in 3-4 months.

## 2015-06-12 NOTE — Progress Notes (Signed)
NEUROLOGY CONSULTATION NOTE  Jac Booton MRN: [id number] DOB: [birth year]  Referring provider: Dr. Georgina Snell Primary care provider: Dr. Georgina Snell  Reason for consult:  CVA  HISTORY OF PRESENT ILLNESS: Jeremy Miller is a 65 year old right-handed male with hypertension, prediabetes and tobacco use who presents for CVA.  History obtained by patient, his girlfriend, PCP notes and cardiology notes.  EKG, echo/carotid doppler reports and labs reviewed.  He had not seen a physician for several years and had not been on any medication.  He is a cigarette smoker with no desire to quit.  On 03/21/15, he was speaking with his girlfriend on the phone.  His voice sounded mumbled.  When she came to see him, he had a facial droop and difficulty writing with his right hand. He was also unsteady on his feet.  Symptoms lasted 3 days and then resolved.  He did not seek medical attention immediately  He established care with a PCP and cardiologist about a week later.  EKG showed bifascicular block.  He saw cardiology.  Nuclear study revealed EF of 53% and 2D echo showed EF of 60-65% with no cardiac thrombus.  He had a 24 hour cardiac monitor which showed normal sinus rhythm and no atrial fibrillation.  Fasting lipid panel showed cholesterol 181, TG 148, HDL 42 and LDL of 109.  Hgb A1c was 5.8.  He was started on ASA 325mg  daily, Lipitor 40mg  daily and Prinzide.  He has no prior history of stroke or TIA.  Hiis mother had several TIAs.  His sister passed away from possibly ALS, but no definite diagnosis was made.  PAST MEDICAL HISTORY: Past Medical History  Diagnosis Date  . Stroke [facility])     PAST SURGICAL HISTORY: Past Surgical History  Procedure Laterality Date  . Appendectomy    . Vasectomy    . Hand surgery    . Nose surgery      MEDICATIONS: Current Outpatient Prescriptions on File Prior to Visit  Medication Sig Dispense Refill  . aspirin 325 MG tablet Take 325 mg by mouth daily.    Marland Kitchen  atorvastatin (LIPITOR) 40 MG tablet Take 1 tablet (40 mg total) by mouth daily. 90 tablet 0  . lisinopril-hydrochlorothiazide (PRINZIDE,ZESTORETIC) 10-12.5 MG tablet Take 1 tablet by mouth daily. 90 tablet 0  . nitroGLYCERIN (NITROSTAT) 0.4 MG SL tablet Place 0.4 mg under the tongue every 5 (five) minutes as needed for chest pain (x 3 pills). Reported on 04/23/2015     No current facility-administered medications on file prior to visit.    ALLERGIES: No Known Allergies  FAMILY HISTORY: Family History  Problem Relation Age of Onset  . Diabetes Mother   . Hypertension Mother   . Hyperlipidemia Mother   . Narcolepsy Mother   . Stroke Mother   . ALS Sister   . Stroke Sister     SOCIAL HISTORY: Social History   Social History  . Marital Status: Divorced    Spouse Name: N/A  . Number of Children: N/A  . Years of Education: N/A   Occupational History  . Not on file.   Social History Main Topics  . Smoking status: Current Every Day Smoker  . Smokeless tobacco: Not on file  . Alcohol Use: No  . Drug Use: No  . Sexual Activity: Not on file   Other Topics Concern  . Not on file   Social History Narrative    REVIEW OF SYSTEMS: Constitutional: No fevers, chills, or sweats,  no generalized fatigue, change in appetite Eyes: No visual changes, double vision, eye pain Ear, nose and throat: No hearing loss, ear pain, nasal congestion, sore throat Cardiovascular: No chest pain, palpitations Respiratory:  No shortness of breath at rest or with exertion, wheezes GastrointestinaI: No nausea, vomiting, diarrhea, abdominal pain, fecal incontinence Genitourinary:  No dysuria, urinary retention or frequency Musculoskeletal:  No neck pain, back pain Integumentary: No rash, pruritus, skin lesions Neurological: as above Psychiatric: No depression, insomnia, anxiety Endocrine: No palpitations, fatigue, diaphoresis, mood swings, change in appetite, change in weight, increased  thirst Hematologic/Lymphatic:  No anemia, purpura, petechiae. Allergic/Immunologic: no itchy/runny eyes, nasal congestion, recent allergic reactions, rashes  PHYSICAL EXAM: Filed Vitals:   06/12/15 1021  BP: 132/66  Pulse: 68   General: No acute distress.  Patient appears well-groomed.  Head:  Normocephalic/atraumatic Eyes:  fundi unremarkable, without vessel changes, exudates, hemorrhages or papilledema. Neck: supple, no paraspinal tenderness, full range of motion Back: No paraspinal tenderness Heart: regular rate and rhythm Lungs: Clear to auscultation bilaterally. Vascular: No carotid bruits. Neurological Exam: Mental status: alert and oriented to person, place, and time, recent and remote memory intact, fund of knowledge intact, attention and concentration intact, speech fluent and not dysarthric, language intact. Cranial nerves: CN I: not tested CN II: pupils equal, round and reactive to light, visual fields intact, fundi unremarkable, without vessel changes, exudates, hemorrhages or papilledema. CN III, IV, VI:  full range of motion, no nystagmus, no ptosis CN V: facial sensation intact CN VII: upper and lower face symmetric CN VIII: hearing intact CN IX, X: gag intact, uvula midline CN XI: sternocleidomastoid and trapezius muscles intact CN XII: tongue midline Bulk & Tone: normal, no fasciculations. Motor:  5/5 throughout Sensation:  Pinprick  and vibration sensation intact. Deep Tendon Reflexes:  2+ throughout, toes downgoing.  Finger to nose testing:  Without dysmetria.  Heel to shin:  Without dysmetria.  Gait:  Normal station and stride.  Able to turn and tandem walk. Romberg negative.  IMPRESSION: CVA Hypertension Cigarette smoker  PLAN: 1.  ASA 325mg  daily for secondary stroke prevention 2.  Lipitor 40mg  daily as per PCP.  LDL goal should be less than 70. 3.  Blood pressure and glycemic control. 4.  Check MRI and MRA of head. 5.  Check carotid doppler 6.   Mediterranean diet 7.  Discussed importance of smoking cessation 8.  Follow up in 3 to 4 months.  Thank you for allowing me to take part in the care of this patient.  Metta Clines, DO  CC:  Lynne Leader, MD

## 2015-06-26 ENCOUNTER — Ambulatory Visit
Admission: RE | Admit: 2015-06-26 | Discharge: 2015-06-26 | Disposition: A | Discharge status: Home / Self Care | Payer: Medicare Other | Source: Ambulatory Visit | Attending: Neurology | Admitting: Neurology

## 2015-06-26 ENCOUNTER — Other Ambulatory Visit: Payer: Self-pay | Admitting: Neurology

## 2015-06-26 DIAGNOSIS — Z77018 Contact with and (suspected) exposure to other hazardous metals: Secondary | ICD-10-CM

## 2015-06-26 DIAGNOSIS — I639 Cerebral infarction, unspecified: Secondary | ICD-10-CM

## 2015-06-26 DIAGNOSIS — Z01818 Encounter for other preprocedural examination: Secondary | ICD-10-CM | POA: Diagnosis not present

## 2015-06-26 DIAGNOSIS — I6523 Occlusion and stenosis of bilateral carotid arteries: Secondary | ICD-10-CM | POA: Diagnosis not present

## 2015-06-26 DIAGNOSIS — R531 Weakness: Secondary | ICD-10-CM | POA: Diagnosis not present

## 2015-06-26 MED ORDER — GADOBENATE DIMEGLUMINE 529 MG/ML IV SOLN
15.0000 mL | Freq: Once | INTRAVENOUS | Status: AC | PRN
Start: 2015-06-26 — End: 2015-06-26
  Administered 2015-06-26: 15 mL via INTRAVENOUS

## 2015-06-27 ENCOUNTER — Telehealth: Payer: Self-pay

## 2015-06-27 NOTE — Telephone Encounter (Signed)
-----   Message from Pieter Partridge, DO sent at 06/27/2015  7:09 AM EDT ----- MRI of brain shows evidence of old strokes, which may have contributed to symptoms, as event occurred a while ago.  I would still treat the event as stroke event.  There is no blockage seen in blood vessels in the brain or on the carotid doppler.  Incidentally, the carotid doppler revealed thyroid nodule.  It may be of no significance, but I would check with Dr. Georgina Snell regarding any further testing if necessary.  I will route this message to Dr. Georgina Snell as well, so he is aware.

## 2015-06-27 NOTE — Telephone Encounter (Signed)
Results sent to Dr. Georgina Snell. Will contact pt after 8 a.m.

## 2015-06-27 NOTE — Telephone Encounter (Signed)
Pt is aware. Will contact Dr. Georgina Snell.

## 2015-07-07 ENCOUNTER — Other Ambulatory Visit: Payer: Self-pay | Admitting: Family Medicine

## 2015-07-09 ENCOUNTER — Other Ambulatory Visit: Payer: Self-pay | Admitting: Family Medicine

## 2015-07-16 ENCOUNTER — Ambulatory Visit (INDEPENDENT_AMBULATORY_CARE_PROVIDER_SITE_OTHER): Payer: Medicare Other | Admitting: Family Medicine

## 2015-07-16 ENCOUNTER — Encounter: Payer: Self-pay | Admitting: Family Medicine

## 2015-07-16 VITALS — BP 126/77 | HR 83 | Wt 167.0 lb

## 2015-07-16 DIAGNOSIS — I119 Hypertensive heart disease without heart failure: Secondary | ICD-10-CM | POA: Diagnosis not present

## 2015-07-16 DIAGNOSIS — I63512 Cerebral infarction due to unspecified occlusion or stenosis of left middle cerebral artery: Secondary | ICD-10-CM

## 2015-07-16 DIAGNOSIS — R7303 Prediabetes: Secondary | ICD-10-CM | POA: Diagnosis not present

## 2015-07-16 DIAGNOSIS — I639 Cerebral infarction, unspecified: Secondary | ICD-10-CM

## 2015-07-16 DIAGNOSIS — Z720 Tobacco use: Secondary | ICD-10-CM

## 2015-07-16 DIAGNOSIS — Z1211 Encounter for screening for malignant neoplasm of colon: Secondary | ICD-10-CM

## 2015-07-16 DIAGNOSIS — I10 Essential (primary) hypertension: Secondary | ICD-10-CM | POA: Diagnosis not present

## 2015-07-16 DIAGNOSIS — F17200 Nicotine dependence, unspecified, uncomplicated: Secondary | ICD-10-CM

## 2015-07-16 MED ORDER — ATORVASTATIN CALCIUM 40 MG PO TABS: 40.0000 mg | ORAL_TABLET | Freq: Every day | ORAL | Status: DC

## 2015-07-16 MED ORDER — ZOSTER VACCINE LIVE 19400 UNT/0.65ML ~~LOC~~ SUSR: 0.6500 mL | Freq: Once | SUBCUTANEOUS | Status: DC

## 2015-07-16 MED ORDER — LISINOPRIL-HYDROCHLOROTHIAZIDE 10-12.5 MG PO TABS: 1.0000 | ORAL_TABLET | Freq: Every day | ORAL | Status: DC

## 2015-07-16 NOTE — Patient Instructions (Signed)
Thank you for coming in today. Return in 6 months.  Get colon cancer screening and Shingles vaccine.  Call or go to the emergency room if you get worse, have trouble breathing, have chest pains, or palpitations.

## 2015-07-16 NOTE — Progress Notes (Signed)
       Jeremy Miller is a 65 y.o. male who presents to [place]: Primary Care today for follow-up hypertension and hyperlipidemia and CVA. Patient feels pretty well without any further stroke or heart symptoms. No chest pain palpitations shortness of breath lightheadedness or dizziness. He is not ready to quit smoking. He is taking his cholesterol and blood pressure medicines. He takes aspirin as well. He's interested in colonoscopy and the shingles vaccine.   Past Medical History  Diagnosis Date  . Stroke [facility])    Past Surgical History  Procedure Laterality Date  . Appendectomy    . Vasectomy    . Hand surgery    . Nose surgery     Social History  Substance Use Topics  . Smoking status: Current Every Day Smoker  . Smokeless tobacco: Not on file  . Alcohol Use: No   family history includes ALS in his sister; Diabetes in his mother; Hyperlipidemia in his mother; Hypertension in his mother; Narcolepsy in his mother; Stroke in his mother and sister.  ROS as above Medications: Current Outpatient Prescriptions  Medication Sig Dispense Refill  . aspirin 325 MG tablet Take 325 mg by mouth daily.    Marland Kitchen atorvastatin (LIPITOR) 40 MG tablet Take 1 tablet (40 mg total) by mouth daily. 90 tablet 1  . lisinopril-hydrochlorothiazide (PRINZIDE,ZESTORETIC) 10-12.5 MG tablet Take 1 tablet by mouth daily. 90 tablet 1  . nitroGLYCERIN (NITROSTAT) 0.4 MG SL tablet Place 0.4 mg under the tongue every 5 (five) minutes as needed for chest pain (x 3 pills). Reported on 04/23/2015    . Zoster Vaccine Live, PF, (ZOSTAVAX) 96295 UNT/0.65ML injection Inject 19,400 Units into the skin once. If given in pharmacy fax report to Dr Georgina Snell [phone] 1 each 0   No current facility-administered medications for this visit.   No Known Allergies   Exam:  BP 126/77 mmHg  Pulse 83  Wt 167 lb (75.751 kg) Gen: Well  NAD HEENT: EOMI,  MMM Lungs: Normal work of breathing. CTABL Heart: RRR no MRG Abd: NABS, Soft. Nondistended, Nontender Exts: Brisk capillary refill, warm and well perfused.  Neuro: Alert and oriented  No results found for this or any previous visit (from the past 24 hour(s)). No results found.   65 year old male with  1) hypertension: Well controlled with lisinopril hydrochlorothiazide. Continue current regimen. Obtain basic fasting labs. 2) hyperlipidemia/CVA: LDL goal of about 70. Continue aspirin. Obtain fasting lipids. 3) smoking: Not ready to quit or cut back. 4) colon cancer screening: Ordered colonoscopy 5) shingles vaccine prescribed

## 2015-07-20 ENCOUNTER — Telehealth: Payer: Self-pay | Admitting: Cardiology

## 2015-07-20 ENCOUNTER — Ambulatory Visit: Payer: PRIVATE HEALTH INSURANCE | Admitting: Family Medicine

## 2015-07-20 NOTE — Telephone Encounter (Signed)
To [employer].

## 2015-07-20 NOTE — Telephone Encounter (Signed)
New message      Request for surgical clearance:  What type of surgery is being performed? colonoscopy When is this surgery scheduled? 07-31-15 Are there any medications that need to be held prior to surgery and how long? Need cardiac clearance  Name of physician performing surgery? Dr Erlene Quan 1. What is your office phone and fax number?  Fax [phone]

## 2015-07-23 NOTE — Telephone Encounter (Signed)
Request for surgical clearance:  1. What type of surgery is being performed? Colonoscopy  2. When is this surgery scheduled? 5/23  3. Are there any medications that need to be held prior to surgery and how long?No sure    4. Name of physician performing surgery? Dr. Erlene Quan  5. What is your office phone and fax number? (f) [phone]  Jeremy Miller [place] [facility]

## 2015-07-23 NOTE — Telephone Encounter (Signed)
Returned Amanda's call @ Dr. Erlene Quan office to let her know that the clearance for colonoscopy will be addressed tomorrow, 07/24/15 lm for her on her vmail

## 2015-07-24 NOTE — Telephone Encounter (Signed)
Stress test 2/17 was low risk for ischemia. EF normal. He is clear to undergo colonoscopy.

## 2015-07-25 NOTE — Telephone Encounter (Signed)
Called [facility] to let her know that the pt is cleared for colonoscopy.   Spoke with Jan and relayed the message to her and she will let Estill Bamberg know.

## 2015-07-31 ENCOUNTER — Telehealth: Payer: Self-pay

## 2015-07-31 DIAGNOSIS — Z1211 Encounter for screening for malignant neoplasm of colon: Secondary | ICD-10-CM | POA: Diagnosis not present

## 2015-07-31 DIAGNOSIS — D123 Benign neoplasm of transverse colon: Secondary | ICD-10-CM | POA: Diagnosis not present

## 2015-07-31 DIAGNOSIS — R7303 Prediabetes: Secondary | ICD-10-CM | POA: Diagnosis not present

## 2015-07-31 DIAGNOSIS — I10 Essential (primary) hypertension: Secondary | ICD-10-CM | POA: Diagnosis not present

## 2015-07-31 DIAGNOSIS — R7309 Other abnormal glucose: Secondary | ICD-10-CM | POA: Diagnosis not present

## 2015-07-31 DIAGNOSIS — I119 Hypertensive heart disease without heart failure: Secondary | ICD-10-CM | POA: Diagnosis not present

## 2015-07-31 NOTE — Telephone Encounter (Signed)
Pt came into clinic and requested to have recommended lab work for him added to what was previously ordered. Per pts overdue health maintenance he is due for screening for HIV and screening for Hep C. Called [facility] and requested these text be added to most recent lap orders.

## 2015-08-01 LAB — COMPREHENSIVE METABOLIC PANEL
ALBUMIN: 4.5 g/dL (ref 3.6–5.1)
ALK PHOS: 115 U/L (ref 40–115)
ALT: 16 U/L (ref 9–46)
AST: 19 U/L (ref 10–35)
BILIRUBIN TOTAL: 0.8 mg/dL (ref 0.2–1.2)
BUN: 8 mg/dL (ref 7–25)
CALCIUM: 9.4 mg/dL (ref 8.6–10.3)
CO2: 23 mmol/L (ref 20–31)
Chloride: 100 mmol/L (ref 98–110)
Creat: 0.9 mg/dL (ref 0.70–1.25)
GLUCOSE: 125 mg/dL — AB (ref 65–99)
POTASSIUM: 4.6 mmol/L (ref 3.5–5.3)
Sodium: 136 mmol/L (ref 135–146)
TOTAL PROTEIN: 7.1 g/dL (ref 6.1–8.1)

## 2015-08-01 LAB — HIV ANTIBODY (ROUTINE TESTING W REFLEX): HIV 1&2 Ab, 4th Generation: NONREACTIVE

## 2015-08-01 LAB — CBC
HEMATOCRIT: 46.1 % (ref 38.5–50.0)
HEMOGLOBIN: 15.9 g/dL (ref 13.2–17.1)
MCH: 30.8 pg (ref 27.0–33.0)
MCHC: 34.5 g/dL (ref 32.0–36.0)
MCV: 89.3 fL (ref 80.0–100.0)
MPV: 8.8 fL (ref 7.5–12.5)
Platelets: 409 10*3/uL — ABNORMAL HIGH (ref 140–400)
RBC: 5.16 MIL/uL (ref 4.20–5.80)
RDW: 14 % (ref 11.0–15.0)
WBC: 12.7 10*3/uL — AB (ref 3.8–10.8)

## 2015-08-01 LAB — LIPID PANEL
Cholesterol: 124 mg/dL — ABNORMAL LOW (ref 125–200)
HDL: 39 mg/dL — ABNORMAL LOW (ref >=40)
LDL CALC: 46 mg/dL (ref <130)
TRIGLYCERIDES: 197 mg/dL — AB (ref <150)
Total CHOL/HDL Ratio: 3.2 Ratio (ref <=5.0)
VLDL: 39 mg/dL — ABNORMAL HIGH (ref <30)

## 2015-08-01 LAB — HEPATITIS C ANTIBODY: HCV AB: NEGATIVE

## 2015-08-01 LAB — HEMOGLOBIN A1C
HEMOGLOBIN A1C: 5.9 % — AB (ref <5.7)
MEAN PLASMA GLUCOSE: 123 mg/dL

## 2015-08-01 NOTE — Progress Notes (Signed)
Quick Note:  Labs look pretty good. Cholesterol is much better.  Blood sugar is still a bit high.  Make sure to avoid carbohydrates. ______

## 2015-09-28 ENCOUNTER — Ambulatory Visit (INDEPENDENT_AMBULATORY_CARE_PROVIDER_SITE_OTHER): Payer: Medicare Other | Admitting: Neurology

## 2015-09-28 ENCOUNTER — Encounter: Payer: Self-pay | Admitting: Neurology

## 2015-09-28 VITALS — BP 118/64 | HR 72 | Ht 72.0 in | Wt 164.0 lb

## 2015-09-28 DIAGNOSIS — Z720 Tobacco use: Secondary | ICD-10-CM | POA: Diagnosis not present

## 2015-09-28 DIAGNOSIS — I10 Essential (primary) hypertension: Secondary | ICD-10-CM

## 2015-09-28 DIAGNOSIS — I639 Cerebral infarction, unspecified: Secondary | ICD-10-CM

## 2015-09-28 DIAGNOSIS — R7303 Prediabetes: Secondary | ICD-10-CM | POA: Diagnosis not present

## 2015-09-28 DIAGNOSIS — I63512 Cerebral infarction due to unspecified occlusion or stenosis of left middle cerebral artery: Secondary | ICD-10-CM

## 2015-09-28 DIAGNOSIS — F17210 Nicotine dependence, cigarettes, uncomplicated: Secondary | ICD-10-CM

## 2015-09-28 NOTE — Progress Notes (Signed)
NEUROLOGY FOLLOW UP OFFICE NOTE  Jeremy Miller ZZ:[id number]  HISTORY OF PRESENT ILLNESS: Jeremy Miller is a 65 year old right-handed male with hypertension, prediabetes and tobacco use who follows up for stroke.  He is accompanied by his girlfriend who provides some history.  UPDATE: MRI of brain from 06/26/15 was personally reviewed and revealed chronic small vessel disease but nothing acute.  MRA of head was personally reviewed and was negative.  Carotid doppler showed no hemodynamically significant stenosis.  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  Repeat lipid panel from 07/31/15 showed LDL of 46.  Hgb A1c was 5.9.  He is feeling well.  No recurrent events.  He still smokes without desire to quit.  His diet is poor.  He eats fast food.  He does not exercise.  HISTORY: He had not seen a physician for several years and had not been on any medication.  He is a cigarette smoker with no desire to quit.  On 03/21/15, he was speaking with his girlfriend on the phone.  His voice sounded mumbled.  When she came to see him, he had a facial droop and difficulty writing with his right hand. He was also unsteady on his feet.  Symptoms lasted 3 days and then resolved.  He did not seek medical attention immediately  He established care with a PCP and cardiologist about a week later.  EKG showed bifascicular block.  He saw cardiology.  Nuclear study revealed EF of 53% and 2D echo showed EF of 60-65% with no cardiac thrombus.  He had a 24 hour cardiac monitor which showed normal sinus rhythm and no atrial fibrillation.  Fasting lipid panel showed cholesterol 181, TG 148, HDL 42 and LDL of 109.  Hgb A1c was 5.8.  He was started on ASA 325mg  daily, Lipitor 40mg  daily and Prinzide.  He has no prior history of stroke or TIA.  His mother had several TIAs.  His sister passed away from possibly ALS, but no definite diagnosis was made.  PAST MEDICAL  HISTORY: Past Medical History  Diagnosis Date  . Stroke [facility])     MEDICATIONS: Current Outpatient Prescriptions on File Prior to Visit  Medication Sig Dispense Refill  . aspirin 325 MG tablet Take 325 mg by mouth daily.    Marland Kitchen atorvastatin (LIPITOR) 40 MG tablet Take 1 tablet (40 mg total) by mouth daily. 90 tablet 1  . lisinopril-hydrochlorothiazide (PRINZIDE,ZESTORETIC) 10-12.5 MG tablet Take 1 tablet by mouth daily. 90 tablet 1  . nitroGLYCERIN (NITROSTAT) 0.4 MG SL tablet Place 0.4 mg under the tongue every 5 (five) minutes as needed for chest pain (x 3 pills). Reported on 04/23/2015     No current facility-administered medications on file prior to visit.    ALLERGIES: No Known Allergies  FAMILY HISTORY: Family History  Problem Relation Age of Onset  . Diabetes Mother   . Hypertension Mother   . Hyperlipidemia Mother   . Narcolepsy Mother   . Stroke Mother   . ALS Sister   . Stroke Sister     SOCIAL HISTORY: Social History   Social History  . Marital Status: Divorced    Spouse Name: N/A  . Number of Children: N/A  . Years of Education: N/A   Occupational History  . Not on file.   Social History Main Topics  . Smoking status: Current Every Day Smoker  . Smokeless tobacco: Not on file  . Alcohol Use:  No  . Drug Use: No  . Sexual Activity: Not on file   Other Topics Concern  . Not on file   Social History Narrative    REVIEW OF SYSTEMS: Constitutional: No fevers, chills, or sweats, no generalized fatigue, change in appetite Eyes: No visual changes, double vision, eye pain Ear, nose and throat: No hearing loss, ear pain, nasal congestion, sore throat Cardiovascular: No chest pain, palpitations Respiratory:  No shortness of breath at rest or with exertion, wheezes GastrointestinaI: No nausea, vomiting, diarrhea, abdominal pain, fecal incontinence Genitourinary:  No dysuria, urinary retention or frequency Musculoskeletal:  No neck pain, back  pain Integumentary: No rash, pruritus, skin lesions Neurological: as above Psychiatric: No depression, insomnia, anxiety Endocrine: No palpitations, fatigue, diaphoresis, mood swings, change in appetite, change in weight, increased thirst Hematologic/Lymphatic:  No purpura, petechiae. Allergic/Immunologic: no itchy/runny eyes, nasal congestion, recent allergic reactions, rashes  PHYSICAL EXAM: Filed Vitals:   09/28/15 0918  BP: 118/64  Pulse: 72   General: No acute distress.  Patient appears well-groomed.  normal body habitus. Head:  Normocephalic/atraumatic Eyes:  Fundi examined but not visualized Neck: supple, no paraspinal tenderness, full range of motion Heart:  Regular rate and rhythm Lungs:  Clear to auscultation bilaterally Back: No paraspinal tenderness Neurological Exam: alert and oriented to person, place, and time. Attention span and concentration intact, recent and remote memory intact, fund of knowledge intact.  Speech fluent and not dysarthric, language intact.  CN II-XII intact. Bulk and tone normal, muscle strength 5/5 throughout.  Sensation to light touch, temperature and vibration intact.  Deep tendon reflexes 2+ throughout, toes downgoing.  Finger to nose and heel to shin testing intact.  Gait normal, Romberg negative.  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   Hypertension Cigarette smoker  PLAN: 1.  Continue ASA 325mg  daily for secondary stroke prevention 2. Continue Lipitor 40mg  daily as per PCP.  LDL goal less than 70. 3.  Continue blood pressure and glycemic control. 4.  Reinforced Mediterranean diet and routine exercise 5.  Discussed importance of smoking cessation 6.  Follow up as needed.  15 minutes spent face to face with patient, over 50% spent counseling.  Metta Clines, DO  CC:  Lynne Leader, MD

## 2015-09-28 NOTE — Patient Instructions (Signed)
1.  Continue aspirin 325mg  daily 2.  Continue Lipitor 3.  Continue blood pressure control 4.  I hope you find the desire to quit smoking 5.  Mediterranean diet 6.  Routine exercise 7.  Follow up as needed.

## 2015-10-16 ENCOUNTER — Telehealth: Payer: Self-pay | Admitting: Family Medicine

## 2015-10-16 NOTE — Telephone Encounter (Signed)
Colonoscopy showed a polyp. Repeat colonoscopy in 5 years. Otherwise everything looked good

## 2015-10-19 NOTE — Telephone Encounter (Signed)
Pt.notified

## 2015-11-05 ENCOUNTER — Ambulatory Visit: Payer: Self-pay

## 2015-11-23 ENCOUNTER — Ambulatory Visit: Payer: Self-pay

## 2015-11-23 ENCOUNTER — Encounter: Payer: Self-pay | Admitting: Family Medicine

## 2015-11-23 ENCOUNTER — Ambulatory Visit (INDEPENDENT_AMBULATORY_CARE_PROVIDER_SITE_OTHER): Payer: Medicare Other | Admitting: Family Medicine

## 2015-11-23 VITALS — BP 106/76 | HR 78 | Wt 170.0 lb

## 2015-11-23 DIAGNOSIS — R1310 Dysphagia, unspecified: Secondary | ICD-10-CM

## 2015-11-23 DIAGNOSIS — I63512 Cerebral infarction due to unspecified occlusion or stenosis of left middle cerebral artery: Secondary | ICD-10-CM

## 2015-11-23 DIAGNOSIS — Z23 Encounter for immunization: Secondary | ICD-10-CM | POA: Diagnosis not present

## 2015-11-23 MED ORDER — OMEPRAZOLE 40 MG PO CPDR: 40.0000 mg | DELAYED_RELEASE_CAPSULE | Freq: Every day | ORAL | 3 refills | Status: DC

## 2015-11-23 NOTE — Progress Notes (Signed)
       Jeremy Miller is a 65 y.o. male who presents to [place]: [facility] today for dysphagia I. Patient notes months of feeling as though food gets stuck when he swallows. He is able to drink liquids without problems. He takes Zantac which does not seem to help very much. No fevers chills nausea vomiting or diarrhea. Otherwise he feels well.   Past Medical History:  Diagnosis Date  . Stroke [facility])    Past Surgical History:  Procedure Laterality Date  . APPENDECTOMY    . HAND SURGERY    . NOSE SURGERY    . VASECTOMY     Social History  Substance Use Topics  . Smoking status: Current Every Day Smoker  . Smokeless tobacco: Not on file  . Alcohol use No   family history includes ALS in his sister; Diabetes in his mother; Hyperlipidemia in his mother; Hypertension in his mother; Narcolepsy in his mother; Stroke in his mother and sister.  ROS as above:  Medications: Current Outpatient Prescriptions  Medication Sig Dispense Refill  . aspirin 325 MG tablet Take 325 mg by mouth daily.    Marland Kitchen atorvastatin (LIPITOR) 40 MG tablet Take 1 tablet (40 mg total) by mouth daily. 90 tablet 1  . lisinopril-hydrochlorothiazide (PRINZIDE,ZESTORETIC) 10-12.5 MG tablet Take 1 tablet by mouth daily. 90 tablet 1  . nitroGLYCERIN (NITROSTAT) 0.4 MG SL tablet Place 0.4 mg under the tongue every 5 (five) minutes as needed for chest pain (x 3 pills). Reported on 04/23/2015    . omeprazole (PRILOSEC) 40 MG capsule Take 1 capsule (40 mg total) by mouth daily. 90 capsule 3   No current facility-administered medications for this visit.    No Known Allergies   Exam:  BP 106/76   Pulse 78   Wt 170 lb (77.1 kg)   BMI 23.06 kg/m  Gen: Well NAD HEENT: EOMI,  MMM Lungs: Normal work of breathing. CTABL Heart: RRR no MRG Abd: NABS, Soft. Nondistended, Nontender Exts: Brisk capillary refill, warm  and well perfused.   No results found for this or any previous visit (from the past 24 hour(s)). No results found.    Assessment and Plan: 65 y.o. male with Dysphasia. Symptoms consistent with stricture or hiatal hernia. This could be globus sensation. Plan to use omeprazole and refer to gastroenterology for upper endoscopy likely.   Flu and pneumonia  vaccines given prior to discharge Orders Placed This Encounter  Procedures  . Ambulatory referral to Gastroenterology    Referral Priority:   Routine    Referral Type:   Consultation    Referral Reason:   Specialty Services Required    Requested Specialty:   Gastroenterology    Number of Visits Requested:   1    Discussed warning signs or symptoms. Please see discharge instructions. Patient expresses understanding.

## 2015-11-23 NOTE — Patient Instructions (Signed)
Thank you for coming in today. You should hear from [facility] Soon.  Return for a well exam around your birthday in [birth year].  Work on cutting back on smoking.   Dysphagia Swallowing problems (dysphagia) occur when solids and liquids seem to stick in your throat on the way down to your stomach, or the food takes longer to get to the stomach. Other symptoms include regurgitating food, noises coming from the throat, chest discomfort with swallowing, and a feeling of fullness or the feeling of something being stuck in your throat when swallowing. When blockage in your throat is complete, it may be associated with drooling. CAUSES  Problems with swallowing may occur because of problems with the muscles. The food cannot be propelled in the usual manner into your stomach. You may have ulcers, scar tissue, or inflammation in the tube down which food travels from your mouth to your stomach (esophagus), which blocks food from passing normally into the stomach. Causes of inflammation include:  Acid reflux from your stomach into your esophagus.  Infection.  Radiation treatment for cancer.  Medicines taken without enough fluids to wash them down into your stomach. You may have nerve problems that prevent signals from being sent to the muscles of your esophagus to contract and move your food down to your stomach. Globus pharyngeus is a relatively common problem in which there is a sense of an obstruction or difficulty in swallowing, without any physical abnormalities of the swallowing passages being found. This problem usually improves over time with reassurance and testing to rule out other causes. DIAGNOSIS Dysphagia can be diagnosed and its cause can be determined by tests in which you swallow a white substance that helps illuminate the inside of your throat (contrast medium) while X-rays are taken. Sometimes a flexible telescope that is inserted down your throat (endoscopy) to look at your esophagus  and stomach is used. TREATMENT   If the dysphagia is caused by acid reflux or infection, medicines may be used.  If the dysphagia is caused by problems with your swallowing muscles, swallowing therapy may be used to help you strengthen your swallowing muscles.  If the dysphagia is caused by a blockage or mass, procedures to remove the blockage may be done. HOME CARE INSTRUCTIONS  Try to eat soft food that is easier to swallow and check your weight on a daily basis to be sure that it is not decreasing.  Be sure to drink liquids when sitting upright (not lying down). SEEK MEDICAL CARE IF:  You are losing weight because you are unable to swallow.  You are coughing when you drink liquids (aspiration).  You are coughing up partially digested food. SEEK IMMEDIATE MEDICAL CARE IF:  You are unable to swallow your own saliva .  You are having shortness of breath or a fever, or both.  You have a hoarse voice along with difficulty swallowing. MAKE SURE YOU:  Understand these instructions.  Will watch your condition.  Will get help right away if you are not doing well or get worse.   This information is not intended to replace advice given to you by your health care provider. Make sure you discuss any questions you have with your health care provider.   Document Released: 02/22/2000 Document Revised: 03/17/2014 Document Reviewed: 08/13/2012 Elsevier Interactive Patient Education [employer].

## 2015-11-23 NOTE — Addendum Note (Signed)
Addended by: Darla Lesches T on: 11/23/2015 01:31 PM   Modules accepted: Orders

## 2016-01-14 ENCOUNTER — Ambulatory Visit: Payer: Medicare Other | Admitting: Family Medicine

## 2016-02-06 ENCOUNTER — Other Ambulatory Visit: Payer: Self-pay | Admitting: Family Medicine

## 2016-02-06 DIAGNOSIS — I10 Essential (primary) hypertension: Secondary | ICD-10-CM

## 2016-02-27 ENCOUNTER — Other Ambulatory Visit: Payer: Self-pay | Admitting: Family Medicine

## 2016-02-27 DIAGNOSIS — I119 Hypertensive heart disease without heart failure: Secondary | ICD-10-CM

## 2016-06-24 ENCOUNTER — Ambulatory Visit (INDEPENDENT_AMBULATORY_CARE_PROVIDER_SITE_OTHER): Payer: Medicare Other | Admitting: Family Medicine

## 2016-06-24 ENCOUNTER — Encounter: Payer: Self-pay | Admitting: Family Medicine

## 2016-06-24 VITALS — BP 139/75 | HR 67 | Ht 72.0 in | Wt 172.0 lb

## 2016-06-24 DIAGNOSIS — F17200 Nicotine dependence, unspecified, uncomplicated: Secondary | ICD-10-CM

## 2016-06-24 DIAGNOSIS — R7303 Prediabetes: Secondary | ICD-10-CM

## 2016-06-24 DIAGNOSIS — I119 Hypertensive heart disease without heart failure: Secondary | ICD-10-CM

## 2016-06-24 DIAGNOSIS — I639 Cerebral infarction, unspecified: Secondary | ICD-10-CM | POA: Diagnosis not present

## 2016-06-24 DIAGNOSIS — Z0000 Encounter for general adult medical examination without abnormal findings: Secondary | ICD-10-CM

## 2016-06-24 DIAGNOSIS — I10 Essential (primary) hypertension: Secondary | ICD-10-CM | POA: Diagnosis not present

## 2016-06-24 LAB — CBC
HCT: 48.3 % (ref 38.5–50.0)
HEMOGLOBIN: 16.5 g/dL (ref 13.2–17.1)
MCH: 30.5 pg (ref 27.0–33.0)
MCHC: 34.2 g/dL (ref 32.0–36.0)
MCV: 89.3 fL (ref 80.0–100.0)
MPV: 9.4 fL (ref 7.5–12.5)
PLATELETS: 383 10*3/uL (ref 140–400)
RBC: 5.41 MIL/uL (ref 4.20–5.80)
RDW: 13.2 % (ref 11.0–15.0)
WBC: 10.5 10*3/uL (ref 3.8–10.8)

## 2016-06-24 LAB — COMPLETE METABOLIC PANEL WITH GFR
ALBUMIN: 4.2 g/dL (ref 3.6–5.1)
ALK PHOS: 113 U/L (ref 40–115)
ALT: 15 U/L (ref 9–46)
AST: 16 U/L (ref 10–35)
BILIRUBIN TOTAL: 0.6 mg/dL (ref 0.2–1.2)
BUN: 7 mg/dL (ref 7–25)
CO2: 30 mmol/L (ref 20–31)
CREATININE: 0.99 mg/dL (ref 0.70–1.25)
Calcium: 9.8 mg/dL (ref 8.6–10.3)
Chloride: 101 mmol/L (ref 98–110)
GFR, EST NON AFRICAN AMERICAN: 79 mL/min (ref >=60)
GFR, Est African American: >89 mL/min (ref >=60)
Glucose, Bld: 94 mg/dL (ref 65–99)
Potassium: 4.7 mmol/L (ref 3.5–5.3)
SODIUM: 140 mmol/L (ref 135–146)
TOTAL PROTEIN: 7.1 g/dL (ref 6.1–8.1)

## 2016-06-24 LAB — LIPID PANEL W/REFLEX DIRECT LDL
CHOL/HDL RATIO: 3.2 ratio (ref <5.0)
CHOLESTEROL: 131 mg/dL (ref <200)
HDL: 41 mg/dL (ref >40)
LDL-Cholesterol: 66 mg/dL
NON-HDL CHOLESTEROL (CALC): 90 mg/dL (ref <130)
Triglycerides: 162 mg/dL — ABNORMAL HIGH (ref <150)

## 2016-06-24 MED ORDER — NITROGLYCERIN 0.4 MG SL SUBL: 0.4000 mg | SUBLINGUAL_TABLET | SUBLINGUAL | 1 refills | Status: DC | PRN

## 2016-06-24 NOTE — Patient Instructions (Addendum)
Thank you for coming in today. Get labs today.  Recheck every 6 months.  Work on cutting back on smoking.  Continue medicines.  Return sooner if needed.

## 2016-06-24 NOTE — Progress Notes (Signed)
1 

## 2016-06-24 NOTE — Progress Notes (Signed)
HPI: Jeremy Miller is a 66 y.o. male  who presents to [facility] today, 06/24/16,  for Medicare Annual Wellness Exam  Patient presents for annual physical/Medicare wellness exam. no complaints today.   Past medical, surgical, social and family history reviewed:  Patient Active Problem List   Diagnosis Date Noted  . Dysphagia 11/23/2015  . 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 Prediabetes 03/29/2015  . CVA (cerebral vascular accident) ([place]) 03/27/2015  . HTN (hypertension) 03/27/2015  . Smokes 03/27/2015  . Bifascicular block 03/27/2015    Past Surgical History:  Procedure Laterality Date  . APPENDECTOMY    . HAND SURGERY    . NOSE SURGERY    . VASECTOMY      Social History   Social History  . Marital status: Divorced    Spouse name: N/A  . Number of children: N/A  . Years of education: N/A   Occupational History  . Not on file.   Social History Main Topics  . Smoking status: Current Every Day Smoker  . Smokeless tobacco: Never Used  . Alcohol use No  . Drug use: No  . Sexual activity: Not on file   Other Topics Concern  . Not on file   Social History Narrative  . No narrative on file    Family History  Problem Relation Age of Onset  . Diabetes Mother   . Hypertension Mother   . Hyperlipidemia Mother   . Narcolepsy Mother   . Stroke Mother   . ALS Sister   . Stroke Sister      Current medication list and allergy/intolerance information reviewed:    Outpatient Encounter Prescriptions as of 06/24/2016  Medication Sig  . aspirin 325 MG tablet Take 325 mg by mouth daily.  Marland Kitchen atorvastatin (LIPITOR) 40 MG tablet TAKE 1 TABLET (40 MG TOTAL) BY MOUTH DAILY.  Marland Kitchen lisinopril-hydrochlorothiazide (PRINZIDE,ZESTORETIC) 10-12.5 MG tablet TAKE 1 TABLET BY MOUTH DAILY.  Marland Kitchen omeprazole (PRILOSEC) 40 MG capsule Take 1 capsule (40  mg total) by mouth daily.  . nitroGLYCERIN (NITROSTAT) 0.4 MG SL tablet Place 1 tablet (0.4 mg total) under the tongue every 5 (five) minutes as needed for chest pain (x 3 pills). Reported on 04/23/2015  . [DISCONTINUED] nitroGLYCERIN (NITROSTAT) 0.4 MG SL tablet Place 0.4 mg under the tongue every 5 (five) minutes as needed for chest pain (x 3 pills). Reported on 04/23/2015   No facility-administered encounter medications on file as of 06/24/2016.     No Known Allergies     Review of Systems: No headache, visual changes, nausea, vomiting, diarrhea, constipation, dizziness, abdominal pain, skin rash, fevers, chills, night sweats, weight loss, swollen lymph nodes, body aches, joint swelling, muscle aches, chest pain, shortness of breath, mood changes, visual or auditory hallucinations.     Medicare Wellness Questionnaire  Are there smokers in your home (other than you)? yes  Depression Screen Depression screen [facility] 2/9 06/24/2016  Decreased Interest 0  Down, Depressed, Hopeless 0  PHQ - 2 Score 0      Activities of Daily Living In your present state of health, do you have any difficulty performing the following activities?:  Driving? no Managing money?  no Feeding yourself? no Getting from bed to chair? no Climbing a flight of stairs? no Preparing food and eating?: no Bathing or showering? no Getting dressed: no Getting to the toilet? no Using the toilet: no Moving  around from place to place: no In the past year have you fallen or had a near fall?: no  Hearing Difficulties:  Do you often ask people to speak up or repeat themselves? no Do you experience ringing or noises in your ears? no  Do you have difficulty understanding soft or whispered voices? no  Memory Difficulties:  Do you feel that you have a problem with memory? no  Do you often misplace items? no  Do you feel safe at home?  yes  Sexual Health:   Are you sexually active?  Yes  Do you have more than one  partner?  No  Advanced Directives:   Advanced directives discussed: has NO advanced directive  - add't info requested. Referral to SW: no  Additional information provided: no  Risk Factors  Current exercise habits: Not much  Dietary issues discussed:balanced  Cardiac risk factors: smoker, hypertension, hypercholesterolemia/hyperlipidemia, prior CVA   Exam:  BP 139/75   Pulse 67   Ht 6' (1.829 m)   Wt 172 lb (78 kg)   BMI 23.33 kg/m  Vision by Snellen chart: right eye:see nurse notes, left eye:see nurse notes  Constitutional: VS see above. General Appearance: alert, well-developed, well-nourished, NAD  Ears, Nose, Mouth, Throat: MMM  Neck: No masses, trachea midline.   Respiratory: Normal respiratory effort. no wheeze, no rhonchi, no rales  Cardiovascular:No lower extremity edema.   Musculoskeletal: Gait normal. No clubbing/cyanosis of digits.   Neurological: Normal balance/coordination. No tremor. Recalls 3 objects and able to read face of watch with correct time.   Skin: warm, dry, intact. No rash/ulcer.   Psychiatric: Normal judgment/insight. Normal mood and affect. Oriented x3.     ASSESSMENT/PLAN:   Encounter for Medicare annual wellness exam   Plan for fasting labs.  Recheck in 6 months.   Essential hypertension - Plan: CBC, COMPLETE METABOLIC PANEL WITH GFR, Lipid Panel w/reflex Direct LDL, Hemoglobin A1c  Cerebrovascular accident (CVA), unspecified mechanism ([place]) - Plan: CBC, COMPLETE METABOLIC PANEL WITH GFR, Lipid Panel w/reflex Direct LDL, Hemoglobin A1c  Smokes - Plan: CBC, COMPLETE METABOLIC PANEL WITH GFR, Lipid Panel w/reflex Direct LDL, Hemoglobin A1c  Prediabetes - Plan: CBC, COMPLETE METABOLIC PANEL WITH GFR, Lipid Panel w/reflex Direct LDL, Hemoglobin A1c  Hypertensive left ventricular hypertrophy, without heart failure - Plan: CBC, COMPLETE METABOLIC PANEL WITH GFR, Lipid Panel w/reflex Direct LDL, Hemoglobin A1c  CANCER  SCREENING  Discussed Immunization History  Administered Date(s) Administered  . Influenza, High Dose Seasonal PF 11/23/2015  . Influenza,inj,Quad PF,36+ Mos 03/27/2015  . Pneumococcal Conjugate-13 11/23/2015  . Tdap 03/27/2015  . Zoster 07/16/2015   OTHER  Fall - exercise and Vit D age 47+ - does not need  Advanced Directives -  Discussed as above   During the course of the visit the patient was educated and counseled about appropriate screening and preventive services as noted above.   Patient Instructions (the written plan) was given to the patient.  Medicare Attestation I have personally reviewed: The patient's medical and social history Their use of alcohol, tobacco or illicit drugs Their current medications and supplements The patient's functional ability including ADLs,fall risks, home safety risks, cognitive, and hearing and visual impairment Diet and physical activities Evidence for depression or mood disorders  The patient's weight, height, BMI, and visual acuity have been recorded in the chart.  I have made referrals, counseling, and provided education to the patient based on review of the above and I have provided the patient with a written  personalized care plan for preventive services.

## 2016-06-25 LAB — HEMOGLOBIN A1C
HEMOGLOBIN A1C: 5.7 % — AB (ref <5.7)
MEAN PLASMA GLUCOSE: 117 mg/dL

## 2016-08-08 ENCOUNTER — Other Ambulatory Visit: Payer: Self-pay

## 2016-08-08 ENCOUNTER — Other Ambulatory Visit: Payer: Self-pay | Admitting: Family Medicine

## 2016-08-08 DIAGNOSIS — I119 Hypertensive heart disease without heart failure: Secondary | ICD-10-CM

## 2016-08-08 DIAGNOSIS — I10 Essential (primary) hypertension: Secondary | ICD-10-CM

## 2016-11-02 ENCOUNTER — Other Ambulatory Visit: Payer: Self-pay | Admitting: Family Medicine

## 2016-11-05 ENCOUNTER — Other Ambulatory Visit: Payer: Self-pay | Admitting: Family Medicine

## 2016-11-05 DIAGNOSIS — I119 Hypertensive heart disease without heart failure: Secondary | ICD-10-CM

## 2016-11-05 DIAGNOSIS — I10 Essential (primary) hypertension: Secondary | ICD-10-CM

## 2016-12-24 ENCOUNTER — Encounter: Payer: Self-pay | Admitting: Family Medicine

## 2016-12-24 ENCOUNTER — Ambulatory Visit (INDEPENDENT_AMBULATORY_CARE_PROVIDER_SITE_OTHER): Payer: Medicare Other | Admitting: Family Medicine

## 2016-12-24 VITALS — BP 108/62 | HR 71 | Wt 161.0 lb

## 2016-12-24 DIAGNOSIS — I119 Hypertensive heart disease without heart failure: Secondary | ICD-10-CM

## 2016-12-24 DIAGNOSIS — Z136 Encounter for screening for cardiovascular disorders: Secondary | ICD-10-CM | POA: Diagnosis not present

## 2016-12-24 DIAGNOSIS — I10 Essential (primary) hypertension: Secondary | ICD-10-CM | POA: Diagnosis not present

## 2016-12-24 DIAGNOSIS — E782 Mixed hyperlipidemia: Secondary | ICD-10-CM

## 2016-12-24 DIAGNOSIS — F17200 Nicotine dependence, unspecified, uncomplicated: Secondary | ICD-10-CM

## 2016-12-24 DIAGNOSIS — Z23 Encounter for immunization: Secondary | ICD-10-CM | POA: Diagnosis not present

## 2016-12-24 DIAGNOSIS — E785 Hyperlipidemia, unspecified: Secondary | ICD-10-CM | POA: Insufficient documentation

## 2016-12-24 DIAGNOSIS — I639 Cerebral infarction, unspecified: Secondary | ICD-10-CM | POA: Diagnosis not present

## 2016-12-24 MED ORDER — ATORVASTATIN CALCIUM 40 MG PO TABS: 40.0000 mg | ORAL_TABLET | Freq: Every day | ORAL | 3 refills | Status: DC

## 2016-12-24 MED ORDER — LISINOPRIL 10 MG PO TABS: 10.0000 mg | ORAL_TABLET | Freq: Every day | ORAL | 3 refills | Status: DC

## 2016-12-24 MED ORDER — ZOSTER VAC RECOMB ADJUVANTED 50 MCG/0.5ML IM SUSR: 0.5000 mL | Freq: Once | INTRAMUSCULAR | 1 refills | Status: AC

## 2016-12-24 NOTE — Patient Instructions (Addendum)
Thank you for coming in today. STOP Lisinopril/HCTZ.  Start Lisinopril.  If you still feel dizzy cut it half and let me know.  Get ultrasound soon.  Recheck with me in 6 months for medicare well exam.  Think about quitting smoking.  We will get fasting labs before the next visit if you remind me.   Continue exercise.  Think about an inhaler for shortness of breath with exercise as needed.    Earwax Buildup, Adult The ears produce a substance called earwax that helps keep bacteria out of the ear and protects the skin in the ear canal. Occasionally, earwax can build up in the ear and cause discomfort or hearing loss. What increases the risk? This condition is more likely to develop in people who:  Are male.  Are elderly.  Naturally produce more earwax.  Clean their ears often with cotton swabs.  Use earplugs often.  Use in-ear headphones often.  Wear hearing aids.  Have narrow ear canals.  Have earwax that is overly thick or sticky.  Have eczema.  Are dehydrated.  Have excess hair in the ear canal.  What are the signs or symptoms? Symptoms of this condition include:  Reduced or muffled hearing.  A feeling of fullness in the ear or feeling that the ear is plugged.  Fluid coming from the ear.  Ear pain.  Ear itch.  Ringing in the ear.  Coughing.  An obvious piece of earwax that can be seen inside the ear canal.  How is this diagnosed? This condition may be diagnosed based on:  Your symptoms.  Your medical history.  An ear exam. During the exam, your health care provider will look into your ear with an instrument called an otoscope.  You may have tests, including a hearing test. How is this treated? This condition may be treated by:  Using ear drops to soften the earwax.  Having the earwax removed by a health care provider. The health care provider may: ? Flush the ear with water. ? Use an instrument that has a loop on the end (curette). ? Use  a suction device.  Surgery to remove the wax buildup. This may be done in severe cases.  Follow these instructions at home:  Take over-the-counter and prescription medicines only as told by your health care provider.  Do not put any objects, including cotton swabs, into your ear. You can clean the opening of your ear canal with a washcloth or facial tissue.  Follow instructions from your health care provider about cleaning your ears. Do not over-clean your ears.  Drink enough fluid to keep your urine clear or pale yellow. This will help to thin the earwax.  Keep all follow-up visits as told by your health care provider. If earwax builds up in your ears often or if you use hearing aids, consider seeing your health care provider for routine, preventive ear cleanings. Ask your health care provider how often you should schedule your cleanings.  If you have hearing aids, clean them according to instructions from the manufacturer and your health care provider. Contact a health care provider if:  You have ear pain.  You develop a fever.  You have blood, pus, or other fluid coming from your ear.  You have hearing loss.  You have ringing in your ears that does not go away.  Your symptoms do not improve with treatment.  You feel like the room is spinning (vertigo). Summary  Earwax can build up in the ear  and cause discomfort or hearing loss.  The most common symptoms of this condition include reduced or muffled hearing and a feeling of fullness in the ear or feeling that the ear is plugged.  This condition may be diagnosed based on your symptoms, your medical history, and an ear exam.  This condition may be treated by using ear drops to soften the earwax or by having the earwax removed by a health care provider.  Do not put any objects, including cotton swabs, into your ear. You can clean the opening of your ear canal with a washcloth or facial tissue. This information is not  intended to replace advice given to you by your health care provider. Make sure you discuss any questions you have with your health care provider. Document Released: 04/03/2004 Document Revised: 05/07/2016 Document Reviewed: 05/07/2016 Elsevier Interactive Patient Education  [employer].

## 2016-12-24 NOTE — Progress Notes (Signed)
Jeremy Miller is a 66 y.o. male who presents to [facility] today for  Hypertension, hyperlipidemia, smoking, screening.  Hypertension: Jeremy Miller takes lisinopril/hydrochlorothiazide daily. He notes occasional episodes of feeling lightheaded and dizzy especially when he stands up. He has intentionally lost about 10 pounds and notes that the symptoms have worsened with weight loss. He denies any fevers or pills chest pain palpitations or shortness of breath.  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 He tolerates the medication well with no muscle aches or pains.  Smoking: Jeremy Miller continues to smoke about a pack a day. He is not interested in quitting smoking.  Screening: Jeremy Miller is mostly up-to-date with his screening tests. He has not had abdominal aorta aneurysm screening.   Past Medical History:  Diagnosis Date  . 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 HTN (hypertension) 03/27/2015  . Prediabetes 03/29/2015  . Stroke [facility])    Past Surgical History:  Procedure Laterality Date  . APPENDECTOMY    . HAND SURGERY    . NOSE SURGERY    . VASECTOMY     Social History  Substance Use Topics  . Smoking status: Current Every Day Smoker  . Smokeless tobacco: Never Used  . Alcohol use No   family history includes ALS in his sister; Diabetes in his mother; Hyperlipidemia in his mother; Hypertension in his mother; Narcolepsy in his mother; Stroke in his mother and sister.  ROS as above:  Medications: Current Outpatient Prescriptions  Medication Sig Dispense Refill  . aspirin 325 MG tablet Take 325 mg by mouth daily.    Marland Kitchen atorvastatin (LIPITOR) 40 MG tablet Take 1 tablet (40 mg total) by mouth daily. 90 tablet 3  . nitroGLYCERIN (NITROSTAT) 0.4 MG SL tablet Place 1 tablet (0.4 mg total) under the  tongue every 5 (five) minutes as needed for chest pain (x 3 pills). Reported on 04/23/2015 25 tablet 1  . omeprazole (PRILOSEC) 40 MG capsule TAKE 1 CAPSULE (40 MG TOTAL) BY MOUTH DAILY. 90 capsule 1  . lisinopril (PRINIVIL,ZESTRIL) 10 MG tablet Take 1 tablet (10 mg total) by mouth daily. 90 tablet 3  . Zoster Vaccine Adjuvanted [facility]) injection Inject 0.5 mLs into the muscle once. 0.5 mL 1   No current facility-administered medications for this visit.    No Known Allergies  Health Maintenance Health Maintenance  Topic Date Due  . INFLUENZA VACCINE  10/08/2016  . PNA vac Low Risk Adult (2 of 2 - PPSV23) 11/22/2016  . COLONOSCOPY  08/01/2020  . TETANUS/TDAP  03/26/2025  . Hepatitis C Screening  Completed     Exam:  BP 108/62   Pulse 71   Wt 161 lb (73 kg)   BMI 21.84 kg/m   Wt Readings from Last 5 Encounters:  12/24/16 161 lb (73 kg)  06/24/16 172 lb (78 kg)  11/23/15 170 lb (77.1 kg)  09/28/15 164 lb (74.4 kg)  07/16/15 167 lb (75.8 kg)    Gen: Well NAD HEENT: EOMI,  MMM Lungs: Normal work of breathing. CTABL Heart: RRR no MRG Abd: NABS, Soft. Nondistended, Nontender Exts: Brisk capillary refill, warm and well perfused.    No results found for this or any previous visit (from the past 72 hour(s)). No results found.    Assessment and Plan: 66 y.o. male with  Hypertension doing well. Plan to discontinue hydrochlorothiazide/lisinopril and switch to just lisinopril. Recheck in 6 months.  Hyperlipidemia: Lipids at goal with LDL less than 70. Continue current regimen. Recheck in 6 months.  Screening: Patient due for AAA screening. Ultrasound ordered.  Smoking: Discussed smoking cessation. Patient is not ready to quit smoking. Continue watchful waiting.  Shingrix vaccine sent to pharmacy.  Orders Placed This Encounter  Procedures  . US ABDOMINAL AORTA SCREENING AAA    Standing Status:   Future    Standing Expiration Date:   02/23/2018    Order Specific  Question:   Reason for Exam (SYMPTOM  OR DIAGNOSIS REQUIRED)    Answer:   screen AAA    Order Specific Question:   Preferred imaging location?    Answer:   [facility]   Meds ordered this encounter  Medications  . lisinopril (PRINIVIL,ZESTRIL) 10 MG tablet    Sig: Take 1 tablet (10 mg total) by mouth daily.    Dispense:  90 tablet    Refill:  3  . atorvastatin (LIPITOR) 40 MG tablet    Sig: Take 1 tablet (40 mg total) by mouth daily.    Dispense:  90 tablet    Refill:  3  . Zoster Vaccine Adjuvanted [facility]) injection    Sig: Inject 0.5 mLs into the muscle once.    Dispense:  0.5 mL    Refill:  1     Discussed warning signs or symptoms. Please see discharge instructions. Patient expresses understanding.

## 2016-12-25 DIAGNOSIS — Z23 Encounter for immunization: Secondary | ICD-10-CM | POA: Diagnosis not present

## 2016-12-25 NOTE — Addendum Note (Signed)
Addended by: Darla Lesches T on: 12/25/2016 09:14 AM   Modules accepted: Orders

## 2016-12-31 ENCOUNTER — Other Ambulatory Visit: Payer: Self-pay | Admitting: Family Medicine

## 2016-12-31 ENCOUNTER — Encounter: Payer: Self-pay | Admitting: Family Medicine

## 2016-12-31 ENCOUNTER — Ambulatory Visit (INDEPENDENT_AMBULATORY_CARE_PROVIDER_SITE_OTHER): Payer: Medicare Other

## 2016-12-31 ENCOUNTER — Ambulatory Visit (INDEPENDENT_AMBULATORY_CARE_PROVIDER_SITE_OTHER): Payer: Medicare Other | Admitting: Family Medicine

## 2016-12-31 ENCOUNTER — Ambulatory Visit (HOSPITAL_BASED_OUTPATIENT_CLINIC_OR_DEPARTMENT_OTHER)
Admission: RE | Admit: 2016-12-31 | Discharge: 2016-12-31 | Disposition: A | Discharge status: Home / Self Care | Payer: Medicare Other | Source: Ambulatory Visit | Attending: Family Medicine | Admitting: Family Medicine

## 2016-12-31 VITALS — BP 112/70 | HR 66

## 2016-12-31 DIAGNOSIS — Z136 Encounter for screening for cardiovascular disorders: Secondary | ICD-10-CM | POA: Insufficient documentation

## 2016-12-31 DIAGNOSIS — F17200 Nicotine dependence, unspecified, uncomplicated: Secondary | ICD-10-CM

## 2016-12-31 DIAGNOSIS — I119 Hypertensive heart disease without heart failure: Secondary | ICD-10-CM | POA: Diagnosis not present

## 2016-12-31 DIAGNOSIS — I714 Abdominal aortic aneurysm, without rupture: Secondary | ICD-10-CM | POA: Diagnosis not present

## 2016-12-31 DIAGNOSIS — I639 Cerebral infarction, unspecified: Secondary | ICD-10-CM

## 2016-12-31 DIAGNOSIS — S8991XA Unspecified injury of right lower leg, initial encounter: Secondary | ICD-10-CM | POA: Diagnosis not present

## 2016-12-31 DIAGNOSIS — S0033XA Contusion of nose, initial encounter: Secondary | ICD-10-CM

## 2016-12-31 DIAGNOSIS — I77819 Aortic ectasia, unspecified site: Secondary | ICD-10-CM | POA: Insufficient documentation

## 2016-12-31 DIAGNOSIS — S022XXA Fracture of nasal bones, initial encounter for closed fracture: Secondary | ICD-10-CM | POA: Diagnosis not present

## 2016-12-31 DIAGNOSIS — I700 Atherosclerosis of aorta: Secondary | ICD-10-CM | POA: Diagnosis not present

## 2016-12-31 DIAGNOSIS — Z8489 Family history of other specified conditions: Secondary | ICD-10-CM | POA: Diagnosis not present

## 2016-12-31 DIAGNOSIS — S8001XA Contusion of right knee, initial encounter: Secondary | ICD-10-CM | POA: Diagnosis not present

## 2016-12-31 NOTE — Patient Instructions (Signed)
Thank you for coming in today. Get xray today.  I will contact you with results.  If no fracture and getting better not specific treatment is needed.  If not getting better let me know.    Contusion A contusion is a deep bruise. Contusions are the result of a blunt injury to tissues and muscle fibers under the skin. The injury causes bleeding under the skin. The skin overlying the contusion may turn blue, purple, or yellow. Minor injuries will give you a painless contusion, but more severe contusions may stay painful and swollen for a few weeks. What are the causes? This condition is usually caused by a blow, trauma, or direct force to an area of the body. What are the signs or symptoms? Symptoms of this condition include:  Swelling of the injured area.  Pain and tenderness in the injured area.  Discoloration. The area may have redness and then turn blue, purple, or yellow.  How is this diagnosed? This condition is diagnosed based on a physical exam and medical history. An X-ray, CT scan, or MRI may be needed to determine if there are any associated injuries, such as broken bones (fractures). How is this treated? Specific treatment for this condition depends on what area of the body was injured. In general, the best treatment for a contusion is resting, icing, applying pressure to (compression), and elevating the injured area. This is often called the RICE strategy. Over-the-counter anti-inflammatory medicines may also be recommended for pain control. Follow these instructions at home:  Rest the injured area.  If directed, apply ice to the injured area: ? Put ice in a plastic bag. ? Place a towel between your skin and the bag. ? Leave the ice on for 20 minutes, 2-3 times per day.  If directed, apply light compression to the injured area using an elastic bandage. Make sure the bandage is not wrapped too tightly. Remove and reapply the bandage as directed by your health care  provider.  If possible, raise (elevate) the injured area above the level of your heart while you are sitting or lying down.  Take over-the-counter and prescription medicines only as told by your health care provider. Contact a health care provider if:  Your symptoms do not improve after several days of treatment.  Your symptoms get worse.  You have difficulty moving the injured area. Get help right away if:  You have severe pain.  You have numbness in a hand or foot.  Your hand or foot turns pale or cold. This information is not intended to replace advice given to you by your health care provider. Make sure you discuss any questions you have with your health care provider. Document Released: 12/04/2004 Document Revised: 07/05/2015 Document Reviewed: 07/12/2014 Elsevier Interactive Patient Education  2017 [employer].
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
## 2017-01-07 ENCOUNTER — Ambulatory Visit (HOSPITAL_BASED_OUTPATIENT_CLINIC_OR_DEPARTMENT_OTHER)
Admission: RE | Admit: 2017-01-07 | Discharge: 2017-01-07 | Disposition: A | Discharge status: Home / Self Care | Payer: Medicare Other | Source: Ambulatory Visit | Attending: Family Medicine | Admitting: Family Medicine

## 2017-01-07 ENCOUNTER — Ambulatory Visit (INDEPENDENT_AMBULATORY_CARE_PROVIDER_SITE_OTHER): Payer: Medicare Other | Admitting: Family Medicine

## 2017-01-07 ENCOUNTER — Encounter: Payer: Self-pay | Admitting: Family Medicine

## 2017-01-07 VITALS — BP 133/71 | HR 73 | Wt 161.0 lb

## 2017-01-07 DIAGNOSIS — M7989 Other specified soft tissue disorders: Secondary | ICD-10-CM | POA: Diagnosis not present

## 2017-01-07 DIAGNOSIS — I639 Cerebral infarction, unspecified: Secondary | ICD-10-CM

## 2017-01-07 DIAGNOSIS — B070 Plantar wart: Secondary | ICD-10-CM | POA: Diagnosis not present

## 2017-01-07 DIAGNOSIS — R600 Localized edema: Secondary | ICD-10-CM | POA: Diagnosis not present

## 2017-01-07 MED ORDER — DICLOFENAC SODIUM 1 % TD GEL: 2.0000 g | Freq: Four times a day (QID) | TRANSDERMAL | 11 refills | Status: DC

## 2017-01-07 NOTE — Patient Instructions (Signed)
Thank you for coming in today. Go to [facility] [place] for ultrasound and xray.  I will contact you with results.  Get voltaren gel for ankle pain.  If you have a blood clot I will send a prescription for eliquis in and we will have a phone call.    Deep Vein Thrombosis A deep vein thrombosis (DVT) is a blood clot (thrombus) that usually occurs in a deep, larger vein of the lower leg or the pelvis, or in an upper extremity such as the arm. These are dangerous and can lead to serious and even life-threatening complications if the clot travels to the lungs. A DVT can damage the valves in your leg veins so that instead of flowing upward, the blood pools in the lower leg. This is called post-thrombotic syndrome, and it can result in pain, swelling, discoloration, and sores on the leg. What are the causes? A DVT is caused by the formation of a blood clot in your leg, pelvis, or arm. Usually, several things contribute to the formation of blood clots. A clot may develop when:  Your blood flow slows down.  Your vein becomes damaged in some way.  You have a condition that makes your blood clot more easily.  What increases the risk? A DVT is more likely to develop in:  People who are older, especially over 33 years of age.  People who are overweight (obese).  People who sit or lie still for a long time, such as during long-distance travel (over 4 hours), bed rest, hospitalization, or during recovery from certain medical conditions like a stroke.  People who do not engage in much physical activity (sedentary lifestyle).  People who have chronic breathing disorders.  People who have a personal or family history of blood clots or blood clotting disease.  People who have peripheral vascular disease (PVD), diabetes, or some types of cancer.  People who have heart disease, especially if the person had a recent heart attack or has congestive heart failure.  People who have neurological  diseases that affect the legs (leg paresis).  People who have had a traumatic injury, such as breaking a hip or leg.  People who have recently had major or lengthy surgery, especially on the hip, knee, or abdomen.  People who have had a central line placed inside a large vein.  People who take medicines that contain the hormone estrogen. These include birth control pills and hormone replacement therapy.  Pregnancy or during childbirth or the postpartum period.  Long plane flights (over 8 hours).  What are the signs or symptoms?  Symptoms of a DVT can include:  Swelling of your leg or arm, especially if one side is much worse.  Warmth and redness of your leg or arm, especially if one side is much worse.  Pain in your arm or leg. If the clot is in your leg, symptoms may be more noticeable or worse when you stand or walk.  A feeling of pins and needles, if the clot is in the arm.  The symptoms of a DVT that has traveled to the lungs (pulmonary embolism, PE) usually start suddenly and include:  Shortness of breath while active or at rest.  Coughing or coughing up blood or blood-tinged mucus.  Chest pain that is often worse with deep breaths.  Rapid or irregular heartbeat.  Feeling light-headed or dizzy.  Fainting.  Feeling anxious.  Sweating.  There may also be pain and swelling in a leg if that is where  the blood clot started. These symptoms may represent a serious problem that is an emergency. Do not wait to see if the symptoms will go away. Get medical help right away. Call your local emergency services (911 in the [place] Do not drive yourself to the hospital. How is this diagnosed? Your health care provider will take a medical history and perform a physical exam. You may also have other tests, including:  Blood tests to assess the clotting properties of your blood.  Imaging tests, such as CT, ultrasound, MRI, X-ray, and other tests to see if you have clots anywhere  in your body.  How is this treated? After a DVT is identified, it can be treated. The type of treatment that you receive depends on many factors, such as the cause of your DVT, your risk for bleeding or developing more clots, and other medical conditions that you have. Sometimes, a combination of treatments is necessary. Treatment options may be combined and include:  Monitoring the blood clot with ultrasound.  Taking medicines by mouth, such as newer blood thinners (anticoagulants), thrombolytics, or warfarin.  Taking anticoagulant medicine by injection or through an IV tube.  Wearing compression stockings or using different types ofdevices.  Surgery (rare) to remove the blood clot or to place a filter in your abdomen to stop the blood clot from traveling to your lungs.  Treatments for a DVT are often divided into immediate treatment and long-term treatment (up to 3 months after DVT). You can work with your health care provider to choose the treatment program that is best for you. Follow these instructions at home: If you are taking a newer oral anticoagulant:  Take the medicine every single day at the same time each day.  Understand what foods and drugs interact with this medicine.  Understand that there are no regular blood tests required when using this medicine.  Understand the side effects of this medicine, including excessive bruising or bleeding. Ask your health care provider or pharmacist about other possible side effects. If you are taking warfarin:  Understand how to take warfarin and know which foods can affect how warfarin works in Veterinary surgeon.  Understand that it is dangerous to take too much or too little warfarin. Too much warfarin increases the risk of bleeding. Too little warfarin continues to allow the risk for blood clots.  Follow your PT and INR blood testing schedule. The PT and INR results allow your health care provider to adjust your dose of warfarin. It is  very important that you have your PT and INR tested as often as told by your health care provider.  Avoid major changes in your diet, or tell your health care provider before you change your diet. Arrange a visit with a registered dietitian to answer your questions. Many foods, especially foods that are high in vitamin K, can interfere with warfarin and affect the PT and INR results. Eat a consistent amount of foods that are high in vitamin K, such as: ? Spinach, kale, broccoli, cabbage, collard greens, turnip greens, Brussels sprouts, peas, cauliflower, seaweed, and parsley. ? Beef liver and pork liver. ? Green tea. ? Soybean oil.  Tell your health care provider about any and all medicines, vitamins, and supplements that you take, including aspirin and other over-the-counter anti-inflammatory medicines. Be especially cautious with aspirin and anti-inflammatory medicines. Do not take those before you ask your health care provider if it is safe to do so. This is important because many medicines can interfere  with warfarin and affect the PT and INR results.  Do not start or stop taking any over-the-counter or prescription medicine unless your health care provider or pharmacist tells you to do so. If you take warfarin, you will also need to do these things:  Hold pressure over cuts for longer than usual.  Tell your dentist and other health care providers that you are taking warfarin before you have any procedures in which bleeding may occur.  Avoid alcohol or drink very small amounts. Tell your health care provider if you change your alcohol intake.  Do not use tobacco products, including cigarettes, chewing tobacco, and e-cigarettes. If you need help quitting, ask your health care provider.  Avoid contact sports.  General instructions  Take over-the-counter and prescription medicines only as told by your health care provider. Anticoagulant medicines can have side effects, including easy  bruising and difficulty stopping bleeding. If you are prescribed an anticoagulant, you will also need to do these things: ? Hold pressure over cuts for longer than usual. ? Tell your dentist and other health care providers that you are taking anticoagulants before you have any procedures in which bleeding may occur. ? Avoid contact sports.  Wear a medical alert bracelet or carry a medical alert card that says you have had a PE.  Ask your health care provider how soon you can go back to your normal activities. Stay active to prevent new blood clots from forming.  Make sure to exercise while traveling or when you have been sitting or standing for a long period of time. It is very important to exercise. Exercise your legs by walking or by tightening and relaxing your leg muscles often. Take frequent walks.  Wear compression stockings as told by your health care provider to help prevent more blood clots from forming.  Do not use tobacco products, including cigarettes, chewing tobacco, and e-cigarettes. If you need help quitting, ask your health care provider.  Keep all follow-up appointments with your health care provider. This is important. How is this prevented? Take these actions to decrease your risk of developing another DVT:  Exercise regularly. For at least 30 minutes every day, engage in: ? Activity that involves moving your arms and legs. ? Activity that encourages good blood flow through your body by increasing your heart rate.  Exercise your arms and legs every hour during long-distance travel (over 4 hours). Drink plenty of water and avoid drinking alcohol while traveling.  Avoid sitting or lying in bed for long periods of time without moving your legs.  Maintain a weight that is appropriate for your height. Ask your health care provider what weight is healthy for you.  If you are a woman who is over 75 years of age, avoid unnecessary use of medicines that contain estrogen. These  include birth control pills.  Do not smoke, especially if you take estrogen medicines. If you need help quitting, ask your health care provider.  If you are hospitalized, prevention measures may include:  Early walking after surgery, as soon as your health care provider says that it is safe.  Receiving anticoagulants to prevent blood clots.If you cannot take anticoagulants, other options may be available, such as wearing compression stockings or using different types of devices.  Get help right away if:  You have new or increased pain, swelling, or redness in an arm or leg.  You have numbness or tingling in an arm or leg.  You have shortness of breath while active or  at rest.  You have chest pain.  You have a rapid or irregular heartbeat.  You feel light-headed or dizzy.  You cough up blood.  You notice blood in your vomit, bowel movement, or urine. These symptoms may represent a serious problem that is an emergency. Do not wait to see if the symptoms will go away. Get medical help right away. Call your local emergency services (911 in the [place] Do not drive yourself to the hospital. This information is not intended to replace advice given to you by your health care provider. Make sure you discuss any questions you have with your health care provider. Document Released: 02/24/2005 Document Revised: 08/02/2015 Document Reviewed: 06/21/2014 Elsevier Interactive Patient Education  2017 [employer].

## 2017-01-08 DIAGNOSIS — B070 Plantar wart: Secondary | ICD-10-CM | POA: Insufficient documentation
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
## 2017-05-03 ENCOUNTER — Other Ambulatory Visit: Payer: Self-pay | Admitting: Family Medicine

## 2017-05-04 ENCOUNTER — Ambulatory Visit (INDEPENDENT_AMBULATORY_CARE_PROVIDER_SITE_OTHER): Payer: Medicare Other | Admitting: Family Medicine

## 2017-05-04 ENCOUNTER — Ambulatory Visit (INDEPENDENT_AMBULATORY_CARE_PROVIDER_SITE_OTHER): Payer: Medicare Other

## 2017-05-04 ENCOUNTER — Encounter: Payer: Self-pay | Admitting: Family Medicine

## 2017-05-04 VITALS — BP 131/82 | HR 79 | Ht 66.0 in | Wt 166.0 lb

## 2017-05-04 DIAGNOSIS — R7303 Prediabetes: Secondary | ICD-10-CM | POA: Diagnosis not present

## 2017-05-04 DIAGNOSIS — R1013 Epigastric pain: Secondary | ICD-10-CM | POA: Diagnosis not present

## 2017-05-04 DIAGNOSIS — X58XXXA Exposure to other specified factors, initial encounter: Secondary | ICD-10-CM

## 2017-05-04 DIAGNOSIS — T184XXA Foreign body in colon, initial encounter: Secondary | ICD-10-CM

## 2017-05-04 LAB — HEMOCCULT GUIAC POC 1CARD (OFFICE): Fecal Occult Blood, POC: NEGATIVE

## 2017-05-04 MED ORDER — SUCRALFATE 1 G PO TABS: 1.0000 g | ORAL_TABLET | Freq: Four times a day (QID) | ORAL | 0 refills | Status: DC

## 2017-05-04 MED ORDER — OMEPRAZOLE 40 MG PO CPDR: 40.0000 mg | DELAYED_RELEASE_CAPSULE | Freq: Two times a day (BID) | ORAL | 1 refills | Status: DC

## 2017-05-04 NOTE — Patient Instructions (Addendum)
Thank you for coming in today. Get labs and xray now.  Start carafate 4x daily.  Take omprazole 2x daily.  We will contact you with results likely tomorrow.  Next step is CT scan.   I am on call this week so if you run into trouble call the office.  Triage is the office staff for acute problems during the day.    Abdominal Pain, Adult Many things can cause belly (abdominal) pain. Most times, belly pain is not dangerous. Many cases of belly pain can be watched and treated at home. Sometimes belly pain is serious, though. Your doctor will try to find the cause of your belly pain. Follow these instructions at home:  Take over-the-counter and prescription medicines only as told by your doctor. Do not take medicines that help you poop (laxatives) unless told to by your doctor.  Drink enough fluid to keep your pee (urine) clear or pale yellow.  Watch your belly pain for any changes.  Keep all follow-up visits as told by your doctor. This is important. Contact a doctor if:  Your belly pain changes or gets worse.  You are not hungry, or you lose weight without trying.  You are having trouble pooping (constipated) or have watery poop (diarrhea) for more than 2-3 days.  You have pain when you pee or poop.  Your belly pain wakes you up at night.  Your pain gets worse with meals, after eating, or with certain foods.  You are throwing up and cannot keep anything down.  You have a fever. Get help right away if:  Your pain does not go away as soon as your doctor says it should.  You cannot stop throwing up.  Your pain is only in areas of your belly, such as the right side or the left lower part of the belly.  You have bloody or black poop, or poop that looks like tar.  You have very bad pain, cramping, or bloating in your belly.  You have signs of not having enough fluid or water in your body (dehydration), such as: ? Dark pee, very little pee, or no pee. ? Cracked lips. ? Dry  mouth. ? Sunken eyes. ? Sleepiness. ? Weakness. This information is not intended to replace advice given to you by your health care provider. Make sure you discuss any questions you have with your health care provider. Document Released: 08/13/2007 Document Revised: 09/14/2015 Document Reviewed: 08/08/2015 Elsevier Interactive Patient Education  2018 [employer].

## 2017-05-04 NOTE — Progress Notes (Signed)
Jeremy Miller is a 67 y.o. male who presents to [place] today for abdominal pain.  Jeremy Miller notes a several week history of continuous epigastric abdominal pain that waxes and wanes.  He denies any vomiting but does note bloating fullness. He denies any constipation. He notes occasional black stools and some diarrhea. He tried Magnesium Citrate laxative which caused a BM but did not help his other symptoms.  He is tried some other over-the-counter medications which have not helped.  He is pretty sure he never tried Pepto-Bismol.  He denies any urinary symptoms.  He denies any significant alcohol intake.  He denies chest pain palpitations or shortness of breath.  His pain is moderate but interfering with sleep and is obnoxious.  He is not sure exactly how the pain relates to eating.  He has a history of colonoscopy in 2017 that showed diverticula and polyps with repeat scan scheduled for 5 years.  He is never had symptoms like this before.  He takes omeprazole daily for GERD and globus sensation history   Past Medical History:  Diagnosis Date  . 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 HTN (hypertension) 03/27/2015  . Prediabetes 03/29/2015  . Stroke [facility])    Past Surgical History:  Procedure Laterality Date  . APPENDECTOMY    . HAND SURGERY    . NOSE SURGERY    . VASECTOMY     Social History   Tobacco Use  . Smoking status: Current Every Day Smoker  . Smokeless tobacco: Never Used  Substance Use Topics  . Alcohol use: No    Alcohol/week: 0.0 oz   family history includes ALS in his sister; Diabetes in his mother; Hyperlipidemia in his mother; Hypertension in his mother; Narcolepsy in his mother; Stroke in his mother and sister.  ROS as above:  Medications: Current Outpatient Medications  Medication Sig Dispense Refill  . aspirin 325 MG  tablet Take 325 mg by mouth daily.    Marland Kitchen atorvastatin (LIPITOR) 40 MG tablet Take 1 tablet (40 mg total) by mouth daily. 90 tablet 3  . diclofenac sodium (VOLTAREN) 1 % GEL Apply 2 g topically 4 (four) times daily. To affected joint. 100 g 11  . lisinopril (PRINIVIL,ZESTRIL) 10 MG tablet Take 1 tablet (10 mg total) by mouth daily. 90 tablet 3  . nitroGLYCERIN (NITROSTAT) 0.4 MG SL tablet Place 1 tablet (0.4 mg total) under the tongue every 5 (five) minutes as needed for chest pain (x 3 pills). Reported on 04/23/2015 25 tablet 1  . omeprazole (PRILOSEC) 40 MG capsule Take 1 capsule (40 mg total) by mouth 2 (two) times daily. 60 capsule 1  . sucralfate (CARAFATE) 1 g tablet Take 1 tablet (1 g total) by mouth 4 (four) times daily. 120 tablet 0   No current facility-administered medications for this visit.    No Known Allergies  Health Maintenance Health Maintenance  Topic Date Due  . COLONOSCOPY  08/01/2020  . TETANUS/TDAP  03/26/2025  . INFLUENZA VACCINE  Completed  . Hepatitis C Screening  Completed  . PNA vac Low Risk Adult  Completed     Exam:  BP 131/82   Pulse 79   Ht 5\' 6"  (1.676 m)   Wt 166 lb (75.3 kg)   BMI 26.79 kg/m  Gen: Well NAD HEENT: EOMI,  MMM Lungs: Normal work of breathing. CTABL Heart: RRR no MRG Abd: NABS, Soft. Nondistended, minimally tender epigastric  area with no rebound or guarding Exts: Brisk capillary refill, warm and well perfused.   Twelve-lead EKG shows normal sinus rhythm at 81 bpm.  Right bundle branch block and left axis deviation present unchanged from EKG 2017.  Abdominal x-ray Independent review of images gas and stool present in the colon.  No significant evidence for small bowel obstruction Awaiting formal radiology review   Point-of-care Hemoccult test negative.   Assessment and Plan: 67 y.o. male with epigastric abdominal pain with unclear etiology. Differential includes pancreatitis, gastritis or duodenitis, gallstones liver  inflammation obstruction. Doubtful for cardiac etiology given unchanged EKG and lack of chest symptoms.   Plan to start workup today with x-ray as noted above.  Additionally will check labs listed below to evaluate inflammation and to help further the diagnosis.  Will treat empirically with omeprazole twice daily and Carafate for presumed gastritis.  Likely if further workup needed we will proceed with a CT scan of the abdomen and pelvis.  Will contact patient tomorrow with results.  We will also recheck A1c given history of prediabetes.  Orders Placed This Encounter  Procedures  . DG Abd 1 View    Order Specific Question:   Reason for exam:    Answer:   Assess stones.    Order Specific Question:   Preferred imaging location?    Answer:   [facility]  . CBC  . COMPLETE METABOLIC PANEL WITH GFR  . Lipase  . Amylase  . Hemoglobin A1c   Meds ordered this encounter  Medications  . sucralfate (CARAFATE) 1 g tablet    Sig: Take 1 tablet (1 g total) by mouth 4 (four) times daily.    Dispense:  120 tablet    Refill:  0  . omeprazole (PRILOSEC) 40 MG capsule    Sig: Take 1 capsule (40 mg total) by mouth 2 (two) times daily.    Dispense:  60 capsule    Refill:  1     Discussed warning signs or symptoms. Please see discharge instructions. Patient expresses understanding.

## 2017-05-05 ENCOUNTER — Other Ambulatory Visit: Payer: Self-pay | Admitting: Family Medicine

## 2017-05-05 ENCOUNTER — Ambulatory Visit (HOSPITAL_BASED_OUTPATIENT_CLINIC_OR_DEPARTMENT_OTHER)
Admission: RE | Admit: 2017-05-05 | Discharge: 2017-05-05 | Disposition: A | Discharge status: Home / Self Care | Payer: Medicare Other | Source: Ambulatory Visit | Attending: Family Medicine | Admitting: Family Medicine

## 2017-05-05 DIAGNOSIS — R140 Abdominal distension (gaseous): Secondary | ICD-10-CM

## 2017-05-05 DIAGNOSIS — K8500 Idiopathic acute pancreatitis without necrosis or infection: Secondary | ICD-10-CM

## 2017-05-05 DIAGNOSIS — M4317 Spondylolisthesis, lumbosacral region: Secondary | ICD-10-CM | POA: Diagnosis not present

## 2017-05-05 DIAGNOSIS — I700 Atherosclerosis of aorta: Secondary | ICD-10-CM | POA: Diagnosis not present

## 2017-05-05 DIAGNOSIS — M4807 Spinal stenosis, lumbosacral region: Secondary | ICD-10-CM | POA: Diagnosis not present

## 2017-05-05 DIAGNOSIS — K5730 Diverticulosis of large intestine without perforation or abscess without bleeding: Secondary | ICD-10-CM | POA: Insufficient documentation

## 2017-05-05 DIAGNOSIS — R1013 Epigastric pain: Secondary | ICD-10-CM | POA: Insufficient documentation

## 2017-05-05 LAB — AMYLASE: Amylase: 37 U/L (ref 21–101)

## 2017-05-05 LAB — CBC
HEMATOCRIT: 48.3 % (ref 38.5–50.0)
Hemoglobin: 16.8 g/dL (ref 13.2–17.1)
MCH: 29.7 pg (ref 27.0–33.0)
MCHC: 34.8 g/dL (ref 32.0–36.0)
MCV: 85.3 fL (ref 80.0–100.0)
MPV: 9.7 fL (ref 7.5–12.5)
PLATELETS: 439 10*3/uL — AB (ref 140–400)
RBC: 5.66 10*6/uL (ref 4.20–5.80)
RDW: 12.5 % (ref 11.0–15.0)
WBC: 12.2 10*3/uL — ABNORMAL HIGH (ref 3.8–10.8)

## 2017-05-05 LAB — LIPASE: Lipase: 73 U/L — ABNORMAL HIGH (ref 7–60)

## 2017-05-05 LAB — COMPLETE METABOLIC PANEL WITH GFR
AG RATIO: 1.6 (calc) (ref 1.0–2.5)
ALT: 9 U/L (ref 9–46)
AST: 12 U/L (ref 10–35)
Albumin: 4.6 g/dL (ref 3.6–5.1)
Alkaline phosphatase (APISO): 112 U/L (ref 40–115)
BUN: 10 mg/dL (ref 7–25)
CALCIUM: 10.2 mg/dL (ref 8.6–10.3)
CHLORIDE: 100 mmol/L (ref 98–110)
CO2: 30 mmol/L (ref 20–32)
Creat: 0.97 mg/dL (ref 0.70–1.25)
GFR, EST AFRICAN AMERICAN: 94 mL/min/{1.73_m2} (ref >=60)
GFR, EST NON AFRICAN AMERICAN: 81 mL/min/{1.73_m2} (ref >=60)
GLOBULIN: 2.9 g/dL (ref 1.9–3.7)
Glucose, Bld: 96 mg/dL (ref 65–99)
POTASSIUM: 5.4 mmol/L — AB (ref 3.5–5.3)
SODIUM: 140 mmol/L (ref 135–146)
TOTAL PROTEIN: 7.5 g/dL (ref 6.1–8.1)
Total Bilirubin: 0.7 mg/dL (ref 0.2–1.2)

## 2017-05-05 LAB — HEMOGLOBIN A1C
HEMOGLOBIN A1C: 5.7 %{Hb} — AB (ref <5.7)
MEAN PLASMA GLUCOSE: 117 (calc)
eAG (mmol/L): 6.5 (calc)

## 2017-05-05 MED ORDER — IOPAMIDOL (ISOVUE-300) INJECTION 61%
100.0000 mL | Freq: Once | INTRAVENOUS | Status: AC | PRN
  Administered 2017-05-05: 100 mL via INTRAVENOUS

## 2017-05-06 NOTE — Addendum Note (Signed)
Addended by: Bo Mcclintock C on: 05/06/2017 07:00 AM   Modules accepted: Orders

## 2017-05-15 NOTE — Addendum Note (Signed)
Addended by: Huel Cote on: 05/15/2017 11:26 AM   Modules accepted: Orders

## 2017-05-31 ENCOUNTER — Other Ambulatory Visit: Payer: Self-pay | Admitting: Family Medicine

## 2017-07-02 ENCOUNTER — Encounter: Payer: Self-pay | Admitting: Family Medicine

## 2017-07-02 ENCOUNTER — Ambulatory Visit (INDEPENDENT_AMBULATORY_CARE_PROVIDER_SITE_OTHER): Payer: Medicare Other | Admitting: Family Medicine

## 2017-07-02 VITALS — BP 139/77 | HR 81 | Ht 65.98 in | Wt 167.0 lb

## 2017-07-02 DIAGNOSIS — M720 Palmar fascial fibromatosis [Dupuytren]: Secondary | ICD-10-CM | POA: Diagnosis not present

## 2017-07-02 DIAGNOSIS — B070 Plantar wart: Secondary | ICD-10-CM

## 2017-07-02 DIAGNOSIS — Z0000 Encounter for general adult medical examination without abnormal findings: Secondary | ICD-10-CM | POA: Diagnosis not present

## 2017-07-02 MED ORDER — ALBUTEROL SULFATE HFA 108 (90 BASE) MCG/ACT IN AERS: 2.0000 | INHALATION_SPRAY | Freq: Four times a day (QID) | RESPIRATORY_TRACT | 0 refills | Status: DC | PRN

## 2017-07-02 MED ORDER — OMEPRAZOLE 40 MG PO CPDR: 40.0000 mg | DELAYED_RELEASE_CAPSULE | Freq: Every day | ORAL | 1 refills | Status: DC

## 2017-07-02 NOTE — Patient Instructions (Addendum)
Thank you for coming in today. Continue ompreazole daily.  STOP sulfrate.  Recheck in 6 months.  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  Work on cutting back on smoking.  USe the albuterol as needed.        Dupuytren Contracture Dupuytren contracture is a condition in which tissue under the skin of the palm becomes abnormally thickened. This causes one or more of the fingers to curl inward (contract) toward the palm. Eventually, the fingers may not be able to straighten out. This condition affects some or all of the fingers and the palm of the hand. It is often passed along from parent to child (inherited). Dupuytren contracture is a long-term (chronic) condition that develops (progresses) slowly over time. There is no cure, but symptoms can be managed and progression can be slowed with treatment. This condition is usually not dangerous or painful, but it can interfere with everyday tasks. What are the causes? This condition is caused by tissue (fascia) in the palm getting thicker and tighter. When the fascia thickens, it pulls on the cords of tissue (tendons) that control finger movement. This causes the fingers to contract. The cause of fascia thickening is not known. What increases the risk? This condition may be more likely to develop in:  People who are age 24 or older.  Men.  People with a family history of this condition.  People who use tobacco products, including cigarettes, chewing tobacco, and e-cigarettes.  People who drink alcohol excessively.  People with diabetes.  People with autoimmune diseases, such as HIV.  People with seizure disorders.  What are the signs or symptoms? Symptoms may develop in one or both hands. Any of the fingers can contract. The fingers farthest from the thumb are commonly affected. Usually, this condition is painless. You may have discomfort when holding or grabbing objects. Early  symptoms of this condition may include:  Thick, puckered skin on the hand.  One or more lumps (nodules) on the palm. Nodules may be tender when they first appear, but they are generally painless.  Symptoms of this condition develop slowly over months or years. Later symptoms of this condition may include:  Thick cords of tissue in the palm.  Fingers curled up toward the palm.  Inability to straighten the fingers into their normal position.  How is this diagnosed? This condition is diagnosed with a physical exam, which may include:  Looking at your hands and feeling your hands. This is to check for thickened fascia and nodules.  Measuring finger motion.  Doing the [employer]. You may be asked to try to put your hand on a surface, with your palm down and your fingers straight out.  How is this treated? There is no cure for this condition, but treatment can make symptoms more manageable and relieve discomfort. Treatment options may include:  Physical therapy. This can strengthen your hand and increase flexibility.  Occupational therapy. This can help you with everyday tasks that may be more difficult because of your condition.  A hand splint.  Shots (injections). Substances may be injected into your hand, such as: ? Medicines that help to decrease swelling (corticosteroids). ? Proteins (collagenase) to weaken thick tissue. After a collagenase injection, your health care provider may stretch your fingers.  Needle aponeurotomy. In this procedure, a needle is pushed through the skin and into the fascia. Moving the needle against the fascia can weaken or break up the  thick tissue.  Surgery. This may be needed if your condition causes discomfort or interferes with everyday activities. Physical therapy is usually needed after surgery.  In some cases, symptoms never develop to the point of needing major treatment, and caring for yourself at home can be enough to manage your  condition. Symptoms often return after treatment. Follow these instructions at home: If you have a splint:  Do not put pressure on any part of the splint until it is fully hardened. This may take several hours.  Wear the splint as told by your health care provider. Remove it only as told by your health care provider.  Loosen the splint if your fingers tingle, become numb, or turn cold and blue.  Do not let your splint get wet if it is not waterproof. ? If your splint is not waterproof, cover it with a watertight covering when you take a bath or a shower. ? Do not take baths, swim, or use a hot tub until your health care provider approves. Ask your health care provider if you can take showers. You may only be allowed to take sponge baths for bathing.  Keep the splint clean.  Ask your health care provider when it is safe to drive. Hand Care  Take these actions to help protect your hand from possible injury: ? Use tools that have padded grips. ? Wear protective gloves while you work with your hands. ? Avoid repetitive hand movements.  Avoid actions that cause pain or discomfort.  Stretch your hand by gently pulling your fingers backward toward your wrist. Do this as often as is comfortable. Stop if this causes pain.  Gently massage your hand as often as is comfortable.  If directed, apply heat to the affected area as often as told by your health care provider. Use the heat source that your health care provider recommends, such as a moist heat pack or a heating pad. ? Place a towel between your skin and the heat source. ? Leave the heat on for 20-30 minutes. ? Remove the heat if your skin turns bright red. This is especially important if you are unable to feel pain, heat, or cold. You may have a greater risk of getting burned. General instructions  Take over-the-counter and prescription medicines only as told by your health care provider.  Manage any other conditions that you have,  such as diabetes.  If physical therapy was prescribed, do exercises as told by your health care provider.  Keep all follow-up visits as told by your health care provider. This is important. Contact a health care provider if:  You develop new symptoms, or your symptoms get worse.  You have pain that gets worse or does not get better with medicine.  You have difficulty or discomfort with everyday tasks.  You have problems with your splint.  You develop numbness or tingling. Get help right away if:  You have severe pain.  Your fingers change color or become unusually cold. This information is not intended to replace advice given to you by your health care provider. Make sure you discuss any questions you have with your health care provider. Document Released: 12/22/2008 Document Revised: 04/10/2015 Document Reviewed: 07/19/2014 Elsevier Interactive Patient Education  [employer].

## 2017-07-02 NOTE — Progress Notes (Signed)
HPI: Jeremy Miller is a 67 y.o. male  who presents to [place] today, 07/02/17,  for Medicare Annual Wellness Exam  Patient presents for annual physical/Medicare wellness exam. -Right foot plantar wart. Present for months. Treating with emory board at home which helps.  -left hand Dupetryn.  Present for months without significant pain or dysfunction.    Past medical, surgical, social and family history reviewed:  Patient Active Problem List   Diagnosis Date Noted  . Plantar wart of right foot 01/08/2017  . HLD (hyperlipidemia) 12/24/2016  . Dysphagia 11/23/2015  . 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 Prediabetes 03/29/2015  . CVA (cerebral vascular accident) ([place]) 03/27/2015  . HTN (hypertension) 03/27/2015  . Smokes 03/27/2015  . Bifascicular block 03/27/2015    Past Surgical History:  Procedure Laterality Date  . APPENDECTOMY    . HAND SURGERY    . NOSE SURGERY    . VASECTOMY      Social History   Socioeconomic History  . Marital status: Divorced    Spouse name: Not on file  . Number of children: Not on file  . Years of education: Not on file  . Highest education level: Not on file  Occupational History  . Not on file  Social Needs  . Financial resource strain: Not on file  . Food insecurity:    Worry: Not on file    Inability: Not on file  . Transportation needs:    Medical: Not on file    Non-medical: Not on file  Tobacco Use  . Smoking status: Current Every Day Smoker  . Smokeless tobacco: Never Used  Substance and Sexual Activity  . Alcohol use: No    Alcohol/week: 0.0 oz  . Drug use: No  . Sexual activity: Not on file  Lifestyle  . Physical activity:    Days per week: Not on file    Minutes per session: Not on file  . Stress: Not on file  Relationships  . Social connections:    Talks on phone: Not on  file    Gets together: Not on file    Attends religious service: Not on file    Active member of club or organization: Not on file    Attends meetings of clubs or organizations: Not on file    Relationship status: Not on file  . Intimate partner violence:    Fear of current or ex partner: Not on file    Emotionally abused: Not on file    Physically abused: Not on file    Forced sexual activity: Not on file  Other Topics Concern  . Not on file  Social History Narrative  . Not on file    Family History  Problem Relation Age of Onset  . Diabetes Mother   . Hypertension Mother   . Hyperlipidemia Mother   . Narcolepsy Mother   . Stroke Mother   . ALS Sister   . Stroke Sister      Current medication list and allergy/intolerance information reviewed:    Outpatient Encounter Medications as of 07/02/2017  Medication Sig  . aspirin 325 MG tablet Take 325 mg by mouth daily.  Marland Kitchen atorvastatin (LIPITOR) 40 MG tablet Take 1 tablet (40 mg total) by mouth daily.  . diclofenac sodium (VOLTAREN) 1 % GEL Apply 2 g topically 4 (four) times daily. To affected joint.  Marland Kitchen lisinopril (PRINIVIL,ZESTRIL) 10  MG tablet Take 1 tablet (10 mg total) by mouth daily.  . nitroGLYCERIN (NITROSTAT) 0.4 MG SL tablet Place 1 tablet (0.4 mg total) under the tongue every 5 (five) minutes as needed for chest pain (x 3 pills). Reported on 04/23/2015  . omeprazole (PRILOSEC) 40 MG capsule Take 1 capsule (40 mg total) by mouth daily.  . [DISCONTINUED] omeprazole (PRILOSEC) 40 MG capsule Take 1 capsule (40 mg total) by mouth 2 (two) times daily.  . [DISCONTINUED] sucralfate (CARAFATE) 1 g tablet TAKE 1 TABLET BY MOUTH 4 TIMES DAILY.   No facility-administered encounter medications on file as of 07/02/2017.     No Known Allergies     Review of Systems: No headache, visual changes, nausea, vomiting, diarrhea, constipation, dizziness, abdominal pain, skin rash, fevers, chills, night sweats, weight loss, swollen lymph  nodes, body aches, joint swelling, muscle aches, chest pain, shortness of breath, mood changes, visual or auditory hallucinations.     Medicare Wellness Questionnaire  Are there smokers in your home (other than you)? no  Depression screen [facility] 2/9 07/02/2017 06/24/2016  Decreased Interest 0 0  Down, Depressed, Hopeless 0 0  PHQ - 2 Score 0 0  Altered sleeping 0 -  Tired, decreased energy 0 -  Change in appetite 0 -  Feeling bad or failure about yourself  0 -  Trouble concentrating 0 -  Moving slowly or fidgety/restless 0 -  Suicidal thoughts 0 -  PHQ-9 Score 0 -  Difficult doing work/chores Not difficult at all -        Activities of Daily Living In your present state of health, do you have any difficulty performing the following activities?:  Driving? no Managing money?  no Feeding yourself? no Getting from bed to chair? no Climbing a flight of stairs? no Preparing food and eating?: no Bathing or showering? no Getting dressed: no Getting to the toilet? no Using the toilet: no Moving around from place to place: no In the past year have you fallen or had a near fall?: no  Hearing Difficulties:  Do you often ask people to speak up or repeat themselves? no Do you experience ringing or noises in your ears? no  Do you have difficulty understanding soft or whispered voices? no  Memory Difficulties:  Do you feel that you have a problem with memory? no  Do you often misplace items? no  Do you feel safe at home?  yes  Sexual Health:   Are you sexually active?  Yes   Do you have more than one partner?  No   Risk Factors  Current exercise habits: Walking  Dietary issues discussed:Yes  Cardiac risk factors: Multiple   Exam:  BP 139/77   Pulse 81   Ht 5' 5.98" (1.676 m)   Wt 167 lb (75.8 kg)   SpO2 98%   BMI 26.97 kg/m  Vision by Snellen chart: right eye:see nurse notes, left eye:see nurse notes  Constitutional: VS see above. General Appearance: alert,  well-developed, well-nourished, NAD  Ears, Nose, Mouth, Throat: MMM  Neck: No masses, trachea midline.   Respiratory: Normal respiratory effort. no wheeze, no rhonchi, no rales  Cardiovascular:No lower extremity edema.   Musculoskeletal: Gait normal. No clubbing/cyanosis of digits.  Left hand Dupuytren's contracture third digit.  Right foot flat plantar wart lateral midfoot plantar  Neurological: Normal balance/coordination. No tremor. Recalls 3 objects and able to read face of watch with correct time.   Skin: warm, dry, intact. No rash/ulcer.   Psychiatric:  Normal judgment/insight. Normal mood and affect. Oriented x3.     ASSESSMENT/PLAN:   Encounter for Medicare annual wellness exam  Continue current medicine.  Recheck 6 months.   Health Maintenance Health Maintenance  Topic Date Due  . INFLUENZA VACCINE  10/08/2017  . COLONOSCOPY  08/01/2020  . TETANUS/TDAP  03/26/2025  . Hepatitis C Screening  Completed  . PNA vac Low Risk Adult  Completed    Immunization History  Administered Date(s) Administered  . Influenza, High Dose Seasonal PF 11/23/2015, 12/25/2016  . Influenza,inj,Quad PF,6+ Mos 03/27/2015  . Pneumococcal Conjugate-13 11/23/2015  . Pneumococcal Polysaccharide-23 12/24/2016  . Tdap 03/27/2015  . Zoster 07/16/2015     During the course of the visit the patient was educated and counseled about appropriate screening and preventive services as noted above.   Patient Instructions (the written plan) was given to the patient.  Medicare Attestation I have personally reviewed: The patient's medical and social history Their use of alcohol, tobacco or illicit drugs Their current medications and supplements The patient's functional ability including ADLs,fall risks, home safety risks, cognitive, and hearing and visual impairment Diet and physical activities Evidence for depression or mood disorders  The patient's weight, height, BMI, and visual acuity have  been recorded in the chart.  I have made referrals, counseling, and provided education to the patient based on review of the above and I have provided the patient with a written personalized care plan for preventive services.

## 2017-08-14 ENCOUNTER — Ambulatory Visit (INDEPENDENT_AMBULATORY_CARE_PROVIDER_SITE_OTHER): Payer: Medicare Other

## 2017-08-14 ENCOUNTER — Encounter: Payer: Self-pay | Admitting: Family Medicine

## 2017-08-14 ENCOUNTER — Ambulatory Visit (INDEPENDENT_AMBULATORY_CARE_PROVIDER_SITE_OTHER): Payer: Medicare Other | Admitting: Family Medicine

## 2017-08-14 VITALS — BP 130/79 | HR 78 | Wt 164.0 lb

## 2017-08-14 DIAGNOSIS — M25551 Pain in right hip: Secondary | ICD-10-CM | POA: Diagnosis not present

## 2017-08-14 DIAGNOSIS — W19XXXA Unspecified fall, initial encounter: Secondary | ICD-10-CM | POA: Diagnosis not present

## 2017-08-14 DIAGNOSIS — S7001XA Contusion of right hip, initial encounter: Secondary | ICD-10-CM | POA: Diagnosis not present

## 2017-08-14 DIAGNOSIS — M25522 Pain in left elbow: Secondary | ICD-10-CM | POA: Diagnosis not present

## 2017-08-14 DIAGNOSIS — M7989 Other specified soft tissue disorders: Secondary | ICD-10-CM | POA: Diagnosis not present

## 2017-08-14 DIAGNOSIS — S0081XA Abrasion of other part of head, initial encounter: Secondary | ICD-10-CM

## 2017-08-14 DIAGNOSIS — S79911A Unspecified injury of right hip, initial encounter: Secondary | ICD-10-CM | POA: Diagnosis not present

## 2017-08-14 NOTE — Patient Instructions (Signed)
Thank you for coming in today. Work on the hip stretching and when able side leg raises.  You may get a blob of soft fluid forming in the bruise. If it does not go away in about 1 months we can drain it.  This is called a seroma.  Recheck with me as needed if all is well.  [facility] usually does a good job.    Seroma A seroma is a collection of fluid on the body that looks like swelling or a mass. Seromas form where tissue has been injured or cut. Seromas vary in size. Some are small and painless. Others may become large and cause pain or discomfort. Many seromas go away on their own as the fluid is naturally absorbed by the body, and some seromas need to be drained. What are the causes? Seromas form as the result of damage to tissue or the removal of tissue. This tissue damage may occur during surgery or because of an injury or trauma. When tissue is disrupted or removed, empty space is created. The body's natural defense system (immune system) causes fluid to enter the empty space and form a seroma. What are the signs or symptoms? Symptoms of this condition include:  Swelling at the site of a surgical cut (incision) or an injury.  Drainage of clear fluid at the surgery or injury site.  Discomfort or pain.  How is this diagnosed? This condition is diagnosed based on your symptoms, your medical history, and a physical exam. During the exam, your health care provider will press on the seroma. You may also have tests, including:  Blood tests.  Imaging tests, such as an ultrasound or CT scan.  How is this treated? Some seromas go away (resolve) on their own. Your health care provider may monitor you to make sure the seroma does not cause any complications. If your seroma does not resolve on its own, treatment may include:  Using a needle to drain the fluid from the seroma (needle aspiration).  Inserting a flexible tube (catheter) to drain the fluid.  Applying a  bandage (dressing), such as an elastic bandage or binder.  Antibiotic medicines, if the seroma becomes infected.  In rare cases, surgery may be done to remove the seroma and repair the area. Follow these instructions at home:  If you were prescribed an antibiotic medicine, take it as told by your health care provider. Do not stop taking the antibiotic even if you start to feel better.  Return to your normal activities as told by your health care provider. Ask your health care provider what activities are safe for you.  Take over-the-counter and prescription medicines only as told by your health care provider.  Check your seroma every day for signs of infection. Check for: ? Redness or pain. ? Fluid or pus. ? More swelling. ? Warmth.  Keep all follow-up visits as told by your health care provider. This is important. Contact a health care provider if:  You have a fever.  You have redness or pain at the site of the seroma.  You have fluid or pus coming from the seroma.  Your seroma is more swollen or is getting bigger.  Your seroma is warm to the touch. This information is not intended to replace advice given to you by your health care provider. Make sure you discuss any questions you have with your health care provider. Document Released: 06/21/2012 Document Revised: 12/07/2015 Document Reviewed: 12/07/2015 Elsevier Interactive Patient Education  2018  [employer].

## 2017-08-14 NOTE — Progress Notes (Signed)
Jeremy Miller is a 67 y.o. male who presents to [place]: [place] today for fall and hip bruising.  Jeremy Miller fell about a week ago.  He missed stepped and fell down about 8 concrete steps.  He thinks he tripped.  He notes that he struck his lips and broke his dentures on his upper lip additionally he had his right lateral hip and left elbow. He notes moderate pain in the hip along with significant bruising.  He notes he is has pain with laying on his right side and with walking but is improving.  He denies any radiating pain weakness or numbness.  He is been treating with ice and heat which have helped.  Additionally he notes some pain in the left elbow.  He struck the extensor surface of his elbow but notes he has pain at the anterior aspect of the elbow.  He thinks he try to grab onto a hand rail to break his fall.  He notes the pain is mild and is improving.  He broke his dentures against the inside of his upper lip.  The dentures have been repaired and the wound in his mouth is healing.  He feels well with no severe mouth pain or dysfunction.  He denies any severe headache grogginess fatigue or imbalance.   ROS as above:  Exam:  BP 130/79   Pulse 78   Wt 164 lb (74.4 kg)   BMI 26.48 kg/m  Gen: Well NAD HEENT: EOMI,  MMM abrasion upper lip with wounds on the inside of the mouth that are well-healing. Lungs: Normal work of breathing. CTABL Heart: RRR no MRG Abd: NABS, Soft. Nondistended, Nontender Exts: Brisk capillary refill, warm and well perfused.  MSK: Significant ecchymosis right lateral hip.  No fluctuance palpated.  Pain at the greater trochanter is present with tenderness to palpation.  Normal gait.  Left elbow: Anterior elbow mildly tender to palpation negative hook test.  Normal motion. Neuro alert and oriented normal coordination balance and  gait.   Lab and Radiology Results 3 view right hip x-ray images independently reviewed by myself No acute fractures present.  Minimal degenerative changes.  Soft tissue swelling. Awaiting for radiology review  Assessment and Plan: 67 y.o. male with right lateral hip pain and contusion.  Doing quite well but patient has significant ecchymosis.  I think it likely that he will eventually develop a seroma.  We spent time discussing hip abductor stretching and strengthening as well as seroma treatment.  If he developed a seroma that does not resolve spontaneously with one 1 month it is reasonable to proceed with aspiration and injection at that time.  The left elbow pain is likely strain of the biceps tendon as it inserts to the proximal radius.  However he notes that his pain is improving and his strength is intact physical exam today.  He has a negative hook test.  Plan for watchful waiting and recheck on this issue if needed.  Face abrasion and contusion.  Doing quite well.  Patient has a normal neurologic exam today.  Plan for watchful waiting and recheck as needed.   Orders Placed This Encounter  Procedures  . DG HIP UNILAT WITH PELVIS 2-3 VIEWS RIGHT    Standing Status:   Future    Number of Occurrences:   1    Standing Expiration Date:   10/15/2018    Order Specific Question:   Reason for Exam (  SYMPTOM  OR DIAGNOSIS REQUIRED)    Answer:   eval right hip pain after fall    Order Specific Question:   Preferred imaging location?    Answer:   [facility]    Order Specific Question:   Radiology Contrast Protocol - do NOT remove file path    Answer:   [email or web address]   No orders of the defined types were placed in this encounter.    Historical information moved to improve visibility of documentation.  Past Medical History:  Diagnosis Date  . 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 HTN (hypertension) 03/27/2015  .  Prediabetes 03/29/2015  . Stroke [facility])    Past Surgical History:  Procedure Laterality Date  . APPENDECTOMY    . HAND SURGERY    . NOSE SURGERY    . VASECTOMY     Social History   Tobacco Use  . Smoking status: Current Every Day Smoker  . Smokeless tobacco: Never Used  Substance Use Topics  . Alcohol use: No    Alcohol/week: 0.0 oz   family history includes ALS in his sister; Diabetes in his mother; Hyperlipidemia in his mother; Hypertension in his mother; Narcolepsy in his mother; Stroke in his mother and sister.  Medications: Current Outpatient Medications  Medication Sig Dispense Refill  . albuterol (PROVENTIL HFA;VENTOLIN HFA) 108 (90 Base) MCG/ACT inhaler Inhale 2 puffs into the lungs every 6 (six) hours as needed for wheezing or shortness of breath. 1 Inhaler 0  . aspirin 325 MG tablet Take 325 mg by mouth daily.    Marland Kitchen atorvastatin (LIPITOR) 40 MG tablet Take 1 tablet (40 mg total) by mouth daily. 90 tablet 3  . diclofenac sodium (VOLTAREN) 1 % GEL Apply 2 g topically 4 (four) times daily. To affected joint. 100 g 11  . lisinopril (PRINIVIL,ZESTRIL) 10 MG tablet Take 1 tablet (10 mg total) by mouth daily. 90 tablet 3  . nitroGLYCERIN (NITROSTAT) 0.4 MG SL tablet Place 1 tablet (0.4 mg total) under the tongue every 5 (five) minutes as needed for chest pain (x 3 pills). Reported on 04/23/2015 25 tablet 1  . omeprazole (PRILOSEC) 40 MG capsule Take 1 capsule (40 mg total) by mouth daily. 90 capsule 1   No current facility-administered medications for this visit.    No Known Allergies  Health Maintenance Health Maintenance  Topic Date Due  . INFLUENZA VACCINE  10/08/2017  . COLONOSCOPY  08/01/2020  . TETANUS/TDAP  03/26/2025  . Hepatitis C Screening  Completed  . PNA vac Low Risk Adult  Completed    Discussed warning signs or symptoms. Please see discharge instructions. Patient expresses understanding.

## 2017-09-18 DIAGNOSIS — M9904 Segmental and somatic dysfunction of sacral region: Secondary | ICD-10-CM | POA: Diagnosis not present

## 2017-09-18 DIAGNOSIS — M5126 Other intervertebral disc displacement, lumbar region: Secondary | ICD-10-CM | POA: Diagnosis not present

## 2017-09-18 DIAGNOSIS — M9905 Segmental and somatic dysfunction of pelvic region: Secondary | ICD-10-CM | POA: Diagnosis not present

## 2017-09-18 DIAGNOSIS — M9903 Segmental and somatic dysfunction of lumbar region: Secondary | ICD-10-CM | POA: Diagnosis not present

## 2017-10-09 DIAGNOSIS — H18413 Arcus senilis, bilateral: Secondary | ICD-10-CM | POA: Diagnosis not present

## 2017-10-09 DIAGNOSIS — H2513 Age-related nuclear cataract, bilateral: Secondary | ICD-10-CM | POA: Diagnosis not present

## 2017-12-06 ENCOUNTER — Other Ambulatory Visit: Payer: Self-pay | Admitting: Family Medicine

## 2017-12-23 DIAGNOSIS — M9905 Segmental and somatic dysfunction of pelvic region: Secondary | ICD-10-CM | POA: Diagnosis not present

## 2017-12-23 DIAGNOSIS — M5126 Other intervertebral disc displacement, lumbar region: Secondary | ICD-10-CM | POA: Diagnosis not present

## 2017-12-23 DIAGNOSIS — M9903 Segmental and somatic dysfunction of lumbar region: Secondary | ICD-10-CM | POA: Diagnosis not present

## 2017-12-23 DIAGNOSIS — M9904 Segmental and somatic dysfunction of sacral region: Secondary | ICD-10-CM | POA: Diagnosis not present

## 2017-12-25 DIAGNOSIS — M9905 Segmental and somatic dysfunction of pelvic region: Secondary | ICD-10-CM | POA: Diagnosis not present

## 2017-12-25 DIAGNOSIS — M9903 Segmental and somatic dysfunction of lumbar region: Secondary | ICD-10-CM | POA: Diagnosis not present

## 2017-12-25 DIAGNOSIS — M9904 Segmental and somatic dysfunction of sacral region: Secondary | ICD-10-CM | POA: Diagnosis not present

## 2017-12-25 DIAGNOSIS — M5126 Other intervertebral disc displacement, lumbar region: Secondary | ICD-10-CM | POA: Diagnosis not present

## 2017-12-31 ENCOUNTER — Encounter: Payer: Self-pay | Admitting: Family Medicine

## 2017-12-31 ENCOUNTER — Ambulatory Visit (INDEPENDENT_AMBULATORY_CARE_PROVIDER_SITE_OTHER): Payer: Medicare Other | Admitting: Family Medicine

## 2017-12-31 VITALS — BP 145/85 | HR 69 | Ht 66.0 in | Wt 166.0 lb

## 2017-12-31 DIAGNOSIS — R7303 Prediabetes: Secondary | ICD-10-CM | POA: Diagnosis not present

## 2017-12-31 DIAGNOSIS — Z23 Encounter for immunization: Secondary | ICD-10-CM | POA: Diagnosis not present

## 2017-12-31 DIAGNOSIS — F17200 Nicotine dependence, unspecified, uncomplicated: Secondary | ICD-10-CM | POA: Diagnosis not present

## 2017-12-31 DIAGNOSIS — E782 Mixed hyperlipidemia: Secondary | ICD-10-CM

## 2017-12-31 DIAGNOSIS — I10 Essential (primary) hypertension: Secondary | ICD-10-CM | POA: Diagnosis not present

## 2017-12-31 DIAGNOSIS — Z6826 Body mass index (BMI) 26.0-26.9, adult: Secondary | ICD-10-CM | POA: Diagnosis not present

## 2017-12-31 DIAGNOSIS — R3915 Urgency of urination: Secondary | ICD-10-CM

## 2017-12-31 DIAGNOSIS — H6123 Impacted cerumen, bilateral: Secondary | ICD-10-CM

## 2017-12-31 MED ORDER — ZOSTER VAC RECOMB ADJUVANTED 50 MCG/0.5ML IM SUSR: 0.5000 mL | Freq: Once | INTRAMUSCULAR | 1 refills | Status: AC

## 2017-12-31 NOTE — Patient Instructions (Addendum)
Thank you for coming in today. Get labs today.  Recheck in early May for well visit.   Return sooner if needed.

## 2017-12-31 NOTE — Progress Notes (Signed)
Jeremy Miller is a 67 y.o. male who presents to [place]: [place] today for his 6 month check-up. Jeremy Miller notes some difficulty hearing (L ear > R ear). He would like to have his ears washed out, as this usually improves his ability to hear. He also complains of some urinary urgency. Denies nocturia. Additionally, he notes improvement in left elbow and right hip pain after fall in June. He has not had any falls since this time. He got new glasses as recommended.   Jeremy Miller reports regularly taking atorvastatin, aspirin, and lisinopril. He typically smokes ~1 carton of cigarettes per week. He has tried decreasing the amount/day in the past, but has been unsuccessful long-term. He is not interesting in quitting smoking at this time.    ROS as above: Denies abdominal pain and epigastric pain wrapping around to his back.   Exam:  BP (!) 144/64   Pulse 76   Ht 5\' 6"  (1.676 m)   Wt 166 lb (75.3 kg)   BMI 26.79 kg/m  Wt Readings from Last 5 Encounters:  12/31/17 166 lb (75.3 kg)  08/14/17 164 lb (74.4 kg)  07/02/17 167 lb (75.8 kg)  05/04/17 166 lb (75.3 kg)  01/07/17 161 lb (73 kg)    Gen: Well NAD HEENT: cerumen present in ears bilaterally Lungs: Normal work of breathing. CTABL Heart: RRR no MRG  Abdomen: Normal active bowel sounds nontender nondistended Extremity: Well-appearing with brisk capillary refill well-perfused appearing. Patient declined digital rectal exam   Assessment and Plan: 67 y.o. male with  Urinary urgency: Likely BPH.  Patient declined digital rectal exam however.  Will check PSA  Difficulty hearing: Cerumen impaction bilaterally. Washed out his ears at today's visit.  Hyperlipidemia: [facility] lipid panel (06/24/16) revealed LDL 66. Lipid panel due for recheck.  Hyperkalemia: Potassium mildly elevated to 5.4 on 05/04/17. Will recheck  CMP today.  Will follow-up in 6 months.  Smoking: Declined smoking cessation.  Will think about cutting back.  CVD risk: Elevated history of stroke.  Statin, ACE inhibitor, aspirin therapy.  BMI 26.  Stable.  Continue to work on lifestyle improvement.   Orders Placed This Encounter  Procedures  . Flu vaccine HIGH DOSE PF (Fluzone High dose)  . CBC  . Hemoglobin A1c  . COMPLETE METABOLIC PANEL WITH GFR  . Lipid Panel w/reflex Direct LDL  . PSA   Meds ordered this encounter  Medications  . Zoster Vaccine Adjuvanted [facility]) injection    Sig: Inject 0.5 mLs into the muscle once for 1 dose.    Dispense:  0.5 mL    Refill:  1     Historical information moved to improve visibility of documentation.  Past Medical History:  Diagnosis Date  . 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 HTN (hypertension) 03/27/2015  . Prediabetes 03/29/2015  . Stroke [facility])    Past Surgical History:  Procedure Laterality Date  . APPENDECTOMY    . HAND SURGERY    . NOSE SURGERY    . VASECTOMY     Social History   Tobacco Use  . Smoking status: Current Every Day Smoker  . Smokeless tobacco: Never Used  Substance Use Topics  . Alcohol use: No    Alcohol/week: 0.0 standard drinks   family history includes ALS in his sister; Diabetes in his mother; Hyperlipidemia in his mother; Hypertension in his mother; Narcolepsy in his mother; Stroke in his mother  and sister.  Medications: Current Outpatient Medications  Medication Sig Dispense Refill  . albuterol (PROVENTIL HFA;VENTOLIN HFA) 108 (90 Base) MCG/ACT inhaler Inhale 2 puffs into the lungs every 6 (six) hours as needed for wheezing or shortness of breath. 1 Inhaler 0  . aspirin 325 MG tablet Take 325 mg by mouth daily.    Marland Kitchen atorvastatin (LIPITOR) 40 MG tablet Take 1 tablet (40 mg total) by mouth daily. 90 tablet 3  . diclofenac sodium (VOLTAREN) 1 % GEL Apply 2 g topically 4 (four) times daily. To affected joint. 100 g 11  .  lisinopril (PRINIVIL,ZESTRIL) 10 MG tablet TAKE 1 TABLET BY MOUTH EVERY DAY 90 tablet 2  . nitroGLYCERIN (NITROSTAT) 0.4 MG SL tablet Place 1 tablet (0.4 mg total) under the tongue every 5 (five) minutes as needed for chest pain (x 3 pills). Reported on 04/23/2015 25 tablet 1  . omeprazole (PRILOSEC) 40 MG capsule Take 1 capsule (40 mg total) by mouth daily. 90 capsule 1  . Zoster Vaccine Adjuvanted [facility]) injection Inject 0.5 mLs into the muscle once for 1 dose. 0.5 mL 1   No current facility-administered medications for this visit.    No Known Allergies   Discussed warning signs or symptoms. Please see discharge instructions. Patient expresses understanding..  I personally was present and performed or re-performed the history, physical exam and medical decision-making activities of this service and have verified that the service and findings are accurately documented in the student's note. ___________________________________________ Lynne Leader M.D., ABFM., CAQSM. Primary Care and Sports Medicine Adjunct Instructor of [employer]

## 2018-01-01 LAB — COMPLETE METABOLIC PANEL WITH GFR
AG Ratio: 1.7 (calc) (ref 1.0–2.5)
ALBUMIN MSPROF: 4.7 g/dL (ref 3.6–5.1)
ALT: 9 U/L (ref 9–46)
AST: 16 U/L (ref 10–35)
Alkaline phosphatase (APISO): 96 U/L (ref 40–115)
BUN: 10 mg/dL (ref 7–25)
CALCIUM: 10.3 mg/dL (ref 8.6–10.3)
CO2: 27 mmol/L (ref 20–32)
CREATININE: 1.01 mg/dL (ref 0.70–1.25)
Chloride: 102 mmol/L (ref 98–110)
GFR, EST NON AFRICAN AMERICAN: 77 mL/min/{1.73_m2} (ref >=60)
GFR, Est African American: 89 mL/min/{1.73_m2} (ref >=60)
GLOBULIN: 2.8 g/dL (ref 1.9–3.7)
GLUCOSE: 98 mg/dL (ref 65–99)
Potassium: 5 mmol/L (ref 3.5–5.3)
SODIUM: 138 mmol/L (ref 135–146)
Total Bilirubin: 0.7 mg/dL (ref 0.2–1.2)
Total Protein: 7.5 g/dL (ref 6.1–8.1)

## 2018-01-01 LAB — LIPID PANEL W/REFLEX DIRECT LDL
CHOL/HDL RATIO: 3.6 (calc) (ref <5.0)
Cholesterol: 159 mg/dL (ref <200)
HDL: 44 mg/dL (ref >40)
LDL CHOLESTEROL (CALC): 85 mg/dL
NON-HDL CHOLESTEROL (CALC): 115 mg/dL (ref <130)
Triglycerides: 198 mg/dL — ABNORMAL HIGH (ref <150)

## 2018-01-01 LAB — PSA: PSA: 1 ng/mL (ref <=4.0)

## 2018-01-01 LAB — CBC
HCT: 47.8 % (ref 38.5–50.0)
HEMOGLOBIN: 16.3 g/dL (ref 13.2–17.1)
MCH: 30.7 pg (ref 27.0–33.0)
MCHC: 34.1 g/dL (ref 32.0–36.0)
MCV: 90 fL (ref 80.0–100.0)
MPV: 10.3 fL (ref 7.5–12.5)
PLATELETS: 359 10*3/uL (ref 140–400)
RBC: 5.31 10*6/uL (ref 4.20–5.80)
RDW: 12.1 % (ref 11.0–15.0)
WBC: 10.2 10*3/uL (ref 3.8–10.8)

## 2018-01-01 LAB — HEMOGLOBIN A1C
Hgb A1c MFr Bld: 5.7 % of total Hgb — ABNORMAL HIGH (ref <5.7)
Mean Plasma Glucose: 117 (calc)
eAG (mmol/L): 6.5 (calc)

## 2018-01-04 DIAGNOSIS — M9903 Segmental and somatic dysfunction of lumbar region: Secondary | ICD-10-CM | POA: Diagnosis not present

## 2018-01-04 DIAGNOSIS — M9904 Segmental and somatic dysfunction of sacral region: Secondary | ICD-10-CM | POA: Diagnosis not present

## 2018-01-04 DIAGNOSIS — M5126 Other intervertebral disc displacement, lumbar region: Secondary | ICD-10-CM | POA: Diagnosis not present

## 2018-01-04 DIAGNOSIS — M9905 Segmental and somatic dysfunction of pelvic region: Secondary | ICD-10-CM | POA: Diagnosis not present

## 2018-01-06 DIAGNOSIS — M5126 Other intervertebral disc displacement, lumbar region: Secondary | ICD-10-CM | POA: Diagnosis not present

## 2018-01-06 DIAGNOSIS — M9905 Segmental and somatic dysfunction of pelvic region: Secondary | ICD-10-CM | POA: Diagnosis not present

## 2018-01-06 DIAGNOSIS — M9904 Segmental and somatic dysfunction of sacral region: Secondary | ICD-10-CM | POA: Diagnosis not present

## 2018-01-06 DIAGNOSIS — M9903 Segmental and somatic dysfunction of lumbar region: Secondary | ICD-10-CM | POA: Diagnosis not present

## 2018-01-08 DIAGNOSIS — M9903 Segmental and somatic dysfunction of lumbar region: Secondary | ICD-10-CM | POA: Diagnosis not present

## 2018-01-08 DIAGNOSIS — M9905 Segmental and somatic dysfunction of pelvic region: Secondary | ICD-10-CM | POA: Diagnosis not present

## 2018-01-08 DIAGNOSIS — M5126 Other intervertebral disc displacement, lumbar region: Secondary | ICD-10-CM | POA: Diagnosis not present

## 2018-01-08 DIAGNOSIS — M9904 Segmental and somatic dysfunction of sacral region: Secondary | ICD-10-CM | POA: Diagnosis not present

## 2018-01-15 DIAGNOSIS — M5126 Other intervertebral disc displacement, lumbar region: Secondary | ICD-10-CM | POA: Diagnosis not present

## 2018-01-15 DIAGNOSIS — M9903 Segmental and somatic dysfunction of lumbar region: Secondary | ICD-10-CM | POA: Diagnosis not present

## 2018-01-15 DIAGNOSIS — M9904 Segmental and somatic dysfunction of sacral region: Secondary | ICD-10-CM | POA: Diagnosis not present

## 2018-01-15 DIAGNOSIS — M9905 Segmental and somatic dysfunction of pelvic region: Secondary | ICD-10-CM | POA: Diagnosis not present

## 2018-03-01 ENCOUNTER — Other Ambulatory Visit: Payer: Self-pay | Admitting: Family Medicine

## 2018-03-01 DIAGNOSIS — I119 Hypertensive heart disease without heart failure: Secondary | ICD-10-CM

## 2018-05-21 DIAGNOSIS — M5126 Other intervertebral disc displacement, lumbar region: Secondary | ICD-10-CM | POA: Diagnosis not present

## 2018-05-21 DIAGNOSIS — M9905 Segmental and somatic dysfunction of pelvic region: Secondary | ICD-10-CM | POA: Diagnosis not present

## 2018-05-21 DIAGNOSIS — M9903 Segmental and somatic dysfunction of lumbar region: Secondary | ICD-10-CM | POA: Diagnosis not present

## 2018-05-21 DIAGNOSIS — M9904 Segmental and somatic dysfunction of sacral region: Secondary | ICD-10-CM | POA: Diagnosis not present

## 2018-06-07 ENCOUNTER — Other Ambulatory Visit: Payer: Self-pay | Admitting: Family Medicine

## 2018-08-27 DIAGNOSIS — M9903 Segmental and somatic dysfunction of lumbar region: Secondary | ICD-10-CM | POA: Diagnosis not present

## 2018-08-27 DIAGNOSIS — M9905 Segmental and somatic dysfunction of pelvic region: Secondary | ICD-10-CM | POA: Diagnosis not present

## 2018-08-27 DIAGNOSIS — M9904 Segmental and somatic dysfunction of sacral region: Secondary | ICD-10-CM | POA: Diagnosis not present

## 2018-08-27 DIAGNOSIS — M5126 Other intervertebral disc displacement, lumbar region: Secondary | ICD-10-CM | POA: Diagnosis not present

## 2018-08-31 ENCOUNTER — Other Ambulatory Visit: Payer: Self-pay | Admitting: Family Medicine

## 2018-09-01 ENCOUNTER — Other Ambulatory Visit: Payer: Self-pay | Admitting: Family Medicine

## 2018-09-09 ENCOUNTER — Other Ambulatory Visit: Payer: Self-pay | Admitting: Family Medicine

## 2018-09-14 ENCOUNTER — Other Ambulatory Visit: Payer: Self-pay | Admitting: Neurology

## 2018-09-14 ENCOUNTER — Encounter: Payer: Self-pay | Admitting: Family Medicine

## 2018-09-14 NOTE — Telephone Encounter (Signed)
Patient called and left vm stating only received 30 day supply of Lisinopril. This was sent 09/01/2018. He has medicare wellness visit in September and asking for RX until that appt. Please advise.

## 2018-09-15 MED ORDER — LISINOPRIL 10 MG PO TABS: 10.0000 mg | ORAL_TABLET | Freq: Every day | ORAL | 1 refills | Status: DC

## 2018-09-15 NOTE — Telephone Encounter (Signed)
Patient made aware.

## 2018-09-15 NOTE — Telephone Encounter (Signed)
Refilled 90 days. Please notify pt

## 2018-11-30 ENCOUNTER — Encounter: Payer: Self-pay | Admitting: Family Medicine

## 2018-11-30 ENCOUNTER — Ambulatory Visit (INDEPENDENT_AMBULATORY_CARE_PROVIDER_SITE_OTHER): Payer: Medicare Other | Admitting: Family Medicine

## 2018-11-30 ENCOUNTER — Other Ambulatory Visit: Payer: Self-pay

## 2018-11-30 VITALS — BP 157/85 | HR 66 | Temp 97.9°F | Wt 168.0 lb

## 2018-11-30 DIAGNOSIS — Z125 Encounter for screening for malignant neoplasm of prostate: Secondary | ICD-10-CM

## 2018-11-30 DIAGNOSIS — Z6827 Body mass index (BMI) 27.0-27.9, adult: Secondary | ICD-10-CM

## 2018-11-30 DIAGNOSIS — I119 Hypertensive heart disease without heart failure: Secondary | ICD-10-CM

## 2018-11-30 DIAGNOSIS — I10 Essential (primary) hypertension: Secondary | ICD-10-CM

## 2018-11-30 DIAGNOSIS — R7303 Prediabetes: Secondary | ICD-10-CM | POA: Diagnosis not present

## 2018-11-30 DIAGNOSIS — Z0000 Encounter for general adult medical examination without abnormal findings: Secondary | ICD-10-CM | POA: Diagnosis not present

## 2018-11-30 DIAGNOSIS — Z23 Encounter for immunization: Secondary | ICD-10-CM | POA: Diagnosis not present

## 2018-11-30 DIAGNOSIS — I5189 Other ill-defined heart diseases: Secondary | ICD-10-CM | POA: Diagnosis not present

## 2018-11-30 DIAGNOSIS — F17200 Nicotine dependence, unspecified, uncomplicated: Secondary | ICD-10-CM | POA: Diagnosis not present

## 2018-11-30 DIAGNOSIS — Z8673 Personal history of transient ischemic attack (TIA), and cerebral infarction without residual deficits: Secondary | ICD-10-CM

## 2018-11-30 DIAGNOSIS — E782 Mixed hyperlipidemia: Secondary | ICD-10-CM | POA: Diagnosis not present

## 2018-11-30 MED ORDER — OMEPRAZOLE 40 MG PO CPDR: 40.0000 mg | DELAYED_RELEASE_CAPSULE | Freq: Every day | ORAL | 3 refills | Status: DC

## 2018-11-30 MED ORDER — LISINOPRIL 10 MG PO TABS: 10.0000 mg | ORAL_TABLET | Freq: Every day | ORAL | 3 refills | Status: DC

## 2018-11-30 MED ORDER — NITROGLYCERIN 0.4 MG SL SUBL: 0.4000 mg | SUBLINGUAL_TABLET | SUBLINGUAL | 1 refills | Status: DC | PRN

## 2018-11-30 MED ORDER — ATORVASTATIN CALCIUM 40 MG PO TABS: 40.0000 mg | ORAL_TABLET | Freq: Every day | ORAL | 3 refills | Status: DC

## 2018-11-30 MED ORDER — ALBUTEROL SULFATE HFA 108 (90 BASE) MCG/ACT IN AERS: 2.0000 | INHALATION_SPRAY | Freq: Four times a day (QID) | RESPIRATORY_TRACT | 0 refills | Status: DC | PRN

## 2018-11-30 MED ORDER — DICLOFENAC SODIUM 1 % TD GEL: 2.0000 g | Freq: Four times a day (QID) | TRANSDERMAL | 11 refills | Status: AC

## 2018-11-30 NOTE — Patient Instructions (Signed)
Thank you for coming in today.  Get labs today.  Continue medicine.  Keep track of blood pressure.  If the pressure is more than 120/70 most of the time let me know and I will increase the dose of the lisinopril.  Recheck yearly if all is well.  Return sooner if needed.    I will be moving to full time Sports Medicine in [place] starting on November 1st.  You will still be able to see me for your Sports Medicine or Orthopedic needs at [facility] in [place]. I will still be part of [place].    If you want to stay locally for your Sports Medicine issues Dr. Dianah Field here in [place] will be happy to see you.  Additionally Dr. Clearance Coots at [facility] will be happy to see you for sports medicine issues more locally.   For your primary care needs you are welcome to establish care with Dr. Emeterio Reeve.  We are working quickly to hire more physicians to cover the primary care needs however if you cannot get an appointment with Dr. Sheppard Coil in a timely manner [place] has locations and openings for primary care services nearby.   [facility] at [facility] [address] . [place] , [place]: [phone] . Behavioral Medicine: [phone] . Fax: [place] at [employer] [address] . [place], [place]: [phone] . Behavioral Medicine: [phone] . Fax: [phone] . Hours (M-F): 7am - Academic librarian At [facility]. [place], [place]: [phone] . Behavioral Medicine: [phone] . Fax: [phone] . Hours (M-F): 8am - Optician, dispensing at [employer] . [place], [place] Phone: [phone] . Behavioral Medicine: [phone] . Fax: [phone]

## 2018-11-30 NOTE — Progress Notes (Signed)
Jeremy Miller is a 68 y.o. male who presents to [place] today for well adult visit.  Jeremy Miller is doing well.  He should not take care of himself but he continues to smoke.  He is not interested in quitting smoking.  He takes medications listed below.  He notes his blood pressures usually in the 120s.  He measured his blood pressure yesterday at the pharmacy it was 126/64.  He exercises regularly.  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  Medications: Current Outpatient Medications  Medication Sig Dispense Refill   albuterol (VENTOLIN HFA) 108 (90 Base) MCG/ACT inhaler Inhale 2 puffs into the lungs every 6 (six) hours as needed for wheezing or shortness of breath. Due for follow up visit w/PCP 16 g 0   aspirin 325 MG tablet Take 325 mg by mouth daily.     atorvastatin (LIPITOR) 40 MG tablet Take 1 tablet (40 mg total) by mouth daily. 90 tablet 3   diclofenac sodium (VOLTAREN) 1 % GEL Apply 2 g topically 4 (four) times daily. To affected joint. 100 g 11   lisinopril (ZESTRIL) 10 MG tablet Take 1 tablet (10 mg total) by mouth daily. 90 tablet 3   nitroGLYCERIN (NITROSTAT) 0.4 MG SL tablet Place 1 tablet (0.4 mg total)  under the tongue every 5 (five) minutes as needed for chest pain (x 3 pills). Reported on 04/23/2015 25 tablet 1   omeprazole (PRILOSEC) 40 MG capsule Take 1 capsule (40 mg total) by mouth daily. 90 capsule 3   No current facility-administered medications for this visit.    No Known Allergies  Health Maintenance Health Maintenance  Topic Date Due   INFLUENZA VACCINE  10/09/2018   COLONOSCOPY  08/01/2020   TETANUS/TDAP  03/26/2025   Hepatitis C Screening  Completed   PNA vac Low Risk Adult  Completed     Exam:  BP (!) 157/85    Pulse 66    Temp 97.9 F (36.6 C) (Oral)    Wt 168 lb (76.2 kg)    BMI 27.12 kg/m  Wt Readings from Last 5 Encounters:  11/30/18 168 lb (76.2 kg)  12/31/17 166 lb (75.3 kg)  08/14/17 164 lb (74.4 kg)  07/02/17 167 lb (75.8 kg)  05/04/17 166 lb (75.3 kg)      Gen: Well NAD HEENT: EOMI,  MMM Lungs: Normal work of breathing. CTABL Heart: RRR no MRG Abd: NABS, Soft. Nondistended, Nontender Exts: Brisk capillary refill, warm and well perfused.  Psych: Alert and oriented normal speech thought process and affect.  Depression screen [facility] 2/9 11/30/2018 07/02/2017 07/02/2017 06/24/2016  Decreased Interest 0 0 0 0  Down, Depressed, Hopeless 0 0 0 0  PHQ -  2 Score 0 0 0 0  Altered sleeping - - 0 -  Tired, decreased energy - - 0 -  Change in appetite - - 0 -  Feeling bad or failure about yourself  - - 0 -  Trouble concentrating - - 0 -  Moving slowly or fidgety/restless - - 0 -  Suicidal thoughts - - 0 -  PHQ-9 Score - - 0 -  Difficult doing work/chores - Not difficult at all Not difficult at all -       Assessment and Plan: 69 y.o. male with well adult.  Doing reasonably well.  Blood pressure remains elevated.  Plan for home blood pressure log recheck in 6 months or so.  Return sooner if needed.  Will modify lisinopril dose if needed.  Hyperlipidemia: Tolerating atorvastatin.  Check lipid panel along with basic labs today.  Will titrate  medication as needed.  Recommend continue aspirin for history of CVA.  Recheck A1c given mild history of prediabetes.  Also check PSA as well for prostate cancer screening.  Patient not ready to quit smoking at this time.  I informed patient that I am transitioning to sports medicine only [place] sports medicine in [place] starting in November.  Happy to see patient for continued sports medicine needs.  Discussed need for new PCP.  Provided some recommendations.  Flu vaccine given today prior to discharge PDMP not reviewed this encounter. Orders Placed This Encounter  Procedures   Flu Vaccine QUAD High Dose(Fluad)   CBC   COMPLETE METABOLIC PANEL WITH GFR   LDL cholesterol, direct   Hemoglobin A1c   PSA   Meds ordered this encounter  Medications   albuterol (VENTOLIN HFA) 108 (90 Base) MCG/ACT inhaler    Sig: Inhale 2 puffs into the lungs every 6 (six) hours as needed for wheezing or shortness of breath. Due for follow up visit w/PCP    Dispense:  16 g    Refill:  0   atorvastatin (LIPITOR) 40 MG tablet    Sig: Take 1 tablet (40 mg total) by mouth daily.    Dispense:  90 tablet    Refill:  3   diclofenac sodium (VOLTAREN) 1 % GEL    Sig: Apply 2 g topically 4 (four) times daily. To affected joint.    Dispense:  100 g    Refill:  11   lisinopril (ZESTRIL) 10 MG tablet    Sig: Take 1 tablet (10 mg total) by mouth daily.    Dispense:  90 tablet    Refill:  3   nitroGLYCERIN (NITROSTAT) 0.4 MG SL tablet    Sig: Place 1 tablet (0.4 mg total) under the tongue every 5 (five) minutes as needed for chest pain (x 3 pills). Reported on 04/23/2015    Dispense:  25 tablet    Refill:  1   omeprazole (PRILOSEC) 40 MG capsule    Sig: Take 1 capsule (40 mg total) by mouth daily.    Dispense:  90 capsule    Refill:  3     Discussed warning signs or symptoms. Please see discharge instructions. Patient expresses understanding.

## 2018-12-01 LAB — COMPLETE METABOLIC PANEL WITHOUT GFR
AG Ratio: 1.7 (calc) (ref 1.0–2.5)
ALT: 11 U/L (ref 9–46)
AST: 15 U/L (ref 10–35)
Albumin: 4.5 g/dL (ref 3.6–5.1)
Alkaline phosphatase (APISO): 89 U/L (ref 35–144)
BUN: 9 mg/dL (ref 7–25)
CO2: 25 mmol/L (ref 20–32)
Calcium: 9.8 mg/dL (ref 8.6–10.3)
Chloride: 107 mmol/L (ref 98–110)
Creat: 0.93 mg/dL (ref 0.70–1.25)
GFR, Est African American: 97 mL/min/{1.73_m2}
GFR, Est Non African American: 84 mL/min/{1.73_m2}
Globulin: 2.6 g/dL (ref 1.9–3.7)
Glucose, Bld: 94 mg/dL (ref 65–99)
Potassium: 5.4 mmol/L — ABNORMAL HIGH (ref 3.5–5.3)
Sodium: 139 mmol/L (ref 135–146)
Total Bilirubin: 0.4 mg/dL (ref 0.2–1.2)
Total Protein: 7.1 g/dL (ref 6.1–8.1)

## 2018-12-01 LAB — HEMOGLOBIN A1C
Hgb A1c MFr Bld: 5.6 %{Hb}
Mean Plasma Glucose: 114 (calc)
eAG (mmol/L): 6.3 (calc)

## 2018-12-01 LAB — LDL CHOLESTEROL, DIRECT: Direct LDL: 72 mg/dL (ref <100)

## 2018-12-01 LAB — CBC
HCT: 46.3 % (ref 38.5–50.0)
Hemoglobin: 15.8 g/dL (ref 13.2–17.1)
MCH: 30.9 pg (ref 27.0–33.0)
MCHC: 34.1 g/dL (ref 32.0–36.0)
MCV: 90.4 fL (ref 80.0–100.0)
MPV: 9.9 fL (ref 7.5–12.5)
Platelets: 373 10*3/uL (ref 140–400)
RBC: 5.12 10*6/uL (ref 4.20–5.80)
RDW: 12.8 % (ref 11.0–15.0)
WBC: 10 10*3/uL (ref 3.8–10.8)

## 2018-12-01 LAB — PSA: PSA: 0.8 ng/mL

## 2018-12-14 ENCOUNTER — Encounter: Payer: Self-pay | Admitting: Family Medicine

## 2018-12-14 DIAGNOSIS — I10 Essential (primary) hypertension: Secondary | ICD-10-CM

## 2018-12-14 DIAGNOSIS — Z5181 Encounter for therapeutic drug level monitoring: Secondary | ICD-10-CM

## 2018-12-14 DIAGNOSIS — E875 Hyperkalemia: Secondary | ICD-10-CM

## 2018-12-15 MED ORDER — HYDROCHLOROTHIAZIDE 25 MG PO TABS: 25.0000 mg | ORAL_TABLET | Freq: Every day | ORAL | 3 refills | Status: DC

## 2018-12-15 NOTE — Addendum Note (Signed)
Addended by: Gregor Hams on: 12/15/2018 10:07 AM   Modules accepted: Orders

## 2018-12-23 ENCOUNTER — Encounter: Payer: Self-pay | Admitting: Family Medicine

## 2018-12-24 MED ORDER — LISINOPRIL 20 MG PO TABS: 20.0000 mg | ORAL_TABLET | Freq: Every day | ORAL | 1 refills | Status: DC

## 2018-12-27 ENCOUNTER — Encounter: Payer: Self-pay | Admitting: Family Medicine

## 2018-12-29 ENCOUNTER — Encounter: Payer: Self-pay | Admitting: Family Medicine

## 2018-12-29 DIAGNOSIS — Z5181 Encounter for therapeutic drug level monitoring: Secondary | ICD-10-CM | POA: Diagnosis not present

## 2018-12-29 DIAGNOSIS — I10 Essential (primary) hypertension: Secondary | ICD-10-CM | POA: Diagnosis not present

## 2018-12-29 DIAGNOSIS — E875 Hyperkalemia: Secondary | ICD-10-CM | POA: Diagnosis not present

## 2018-12-30 LAB — BASIC METABOLIC PANEL WITH GFR
BUN: 9 mg/dL (ref 7–25)
CO2: 28 mmol/L (ref 20–32)
Calcium: 9.9 mg/dL (ref 8.6–10.3)
Chloride: 100 mmol/L (ref 98–110)
Creat: 0.91 mg/dL (ref 0.70–1.25)
GFR, Est African American: 100 mL/min/{1.73_m2} (ref >=60)
GFR, Est Non African American: 86 mL/min/{1.73_m2} (ref >=60)
Glucose, Bld: 91 mg/dL (ref 65–99)
Potassium: 4.1 mmol/L (ref 3.5–5.3)
Sodium: 137 mmol/L (ref 135–146)

## 2019-01-20 ENCOUNTER — Other Ambulatory Visit: Payer: Self-pay | Admitting: Family Medicine

## 2019-04-29 DIAGNOSIS — Z23 Encounter for immunization: Secondary | ICD-10-CM | POA: Diagnosis not present

## 2019-05-18 DIAGNOSIS — M9903 Segmental and somatic dysfunction of lumbar region: Secondary | ICD-10-CM | POA: Diagnosis not present

## 2019-05-18 DIAGNOSIS — M9904 Segmental and somatic dysfunction of sacral region: Secondary | ICD-10-CM | POA: Diagnosis not present

## 2019-05-18 DIAGNOSIS — M5126 Other intervertebral disc displacement, lumbar region: Secondary | ICD-10-CM | POA: Diagnosis not present

## 2019-05-18 DIAGNOSIS — M9905 Segmental and somatic dysfunction of pelvic region: Secondary | ICD-10-CM | POA: Diagnosis not present

## 2019-05-25 DIAGNOSIS — Z23 Encounter for immunization: Secondary | ICD-10-CM | POA: Diagnosis not present

## 2019-06-13 IMAGING — CT CT ABD-PELV W/ CM
2 of 5 series · 15 of 46 positions shown, 17 images · IV contrast (APPLIED)
Comparison: Radiographs 05/04/2017.

CLINICAL DATA: Upper midline abdominal pain and tenderness with
bloating and distention. History of stroke, hypertension and
diastolic heart failure. Previous appendectomy.

EXAM:
CT ABDOMEN AND PELVIS WITH CONTRAST
TECHNIQUE: Multidetector CT imaging of the abdomen and pelvis was performed
using the standard protocol following bolus administration of
intravenous contrast.
CONTRAST:  100mL ROD5D0-M00 IOPAMIDOL (ROD5D0-M00) INJECTION 61%

[Series 2: axial st · axial · 0.86mm/px · z∈[-707,-257]mm · 12 of 101 slices shown, 14 images]
[im 6/101  soft-tissue]
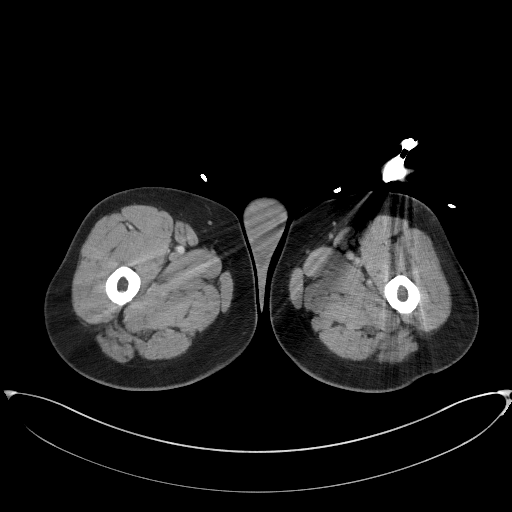
[im 6/101  bone]
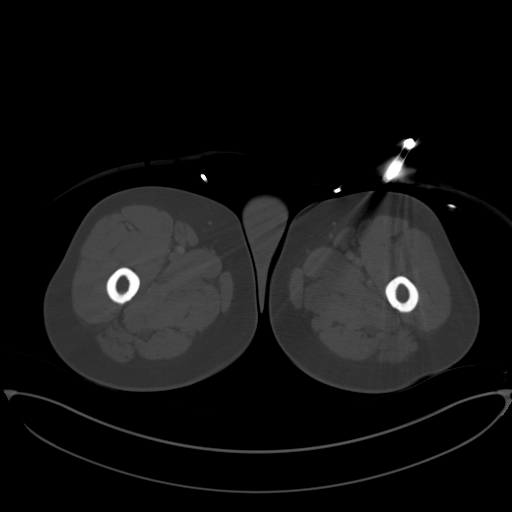
[im 16/101  soft-tissue]
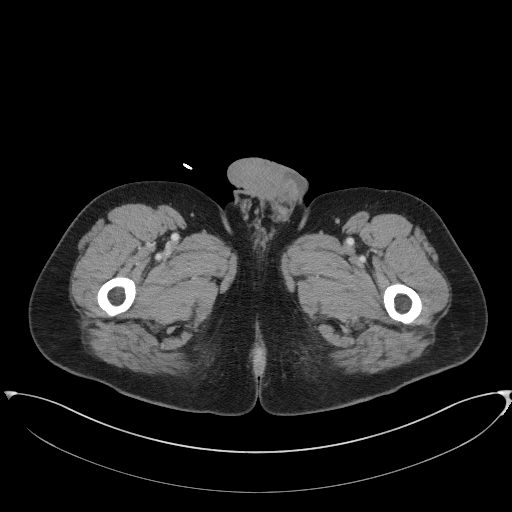
[im 21/101  soft-tissue]
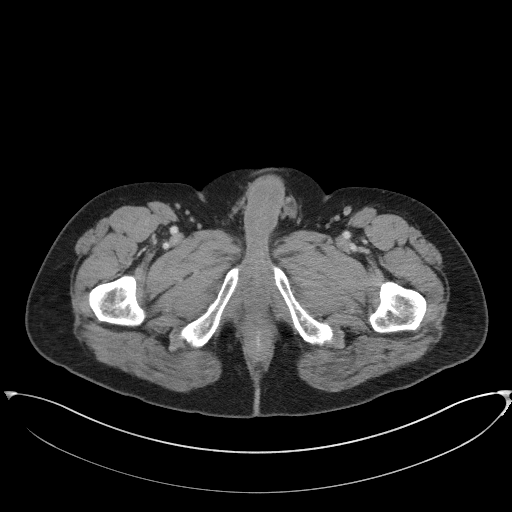
[im 31/101  soft-tissue]
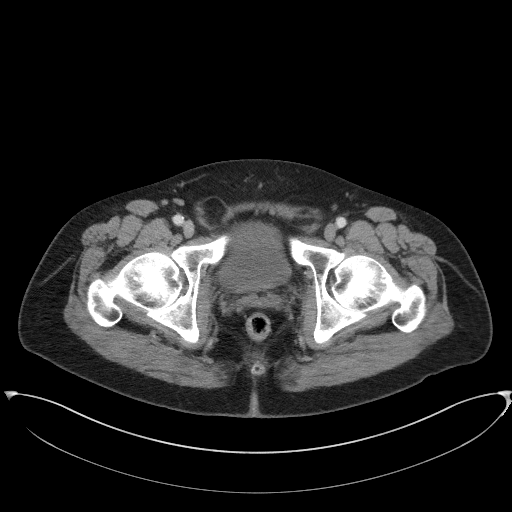
[im 41/101  soft-tissue]
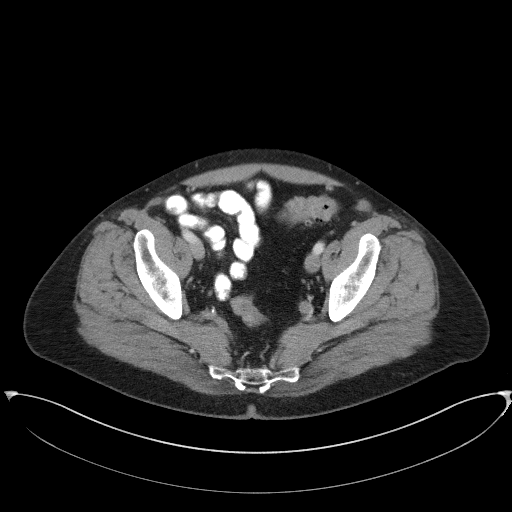
[im 46/101  soft-tissue]
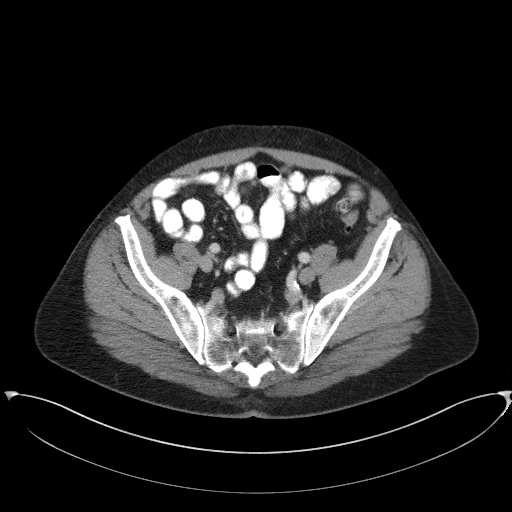
[im 56/101  soft-tissue]
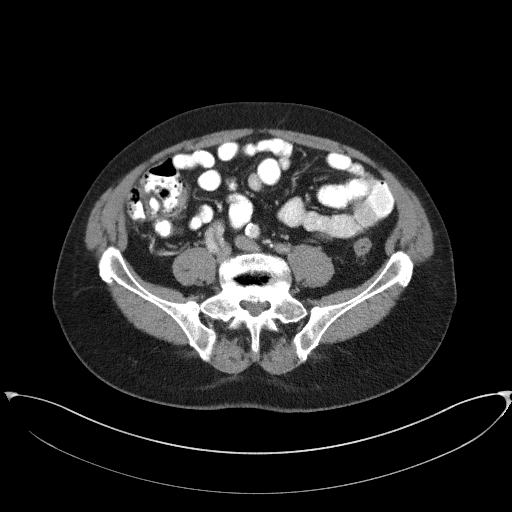
[im 61/101  soft-tissue]
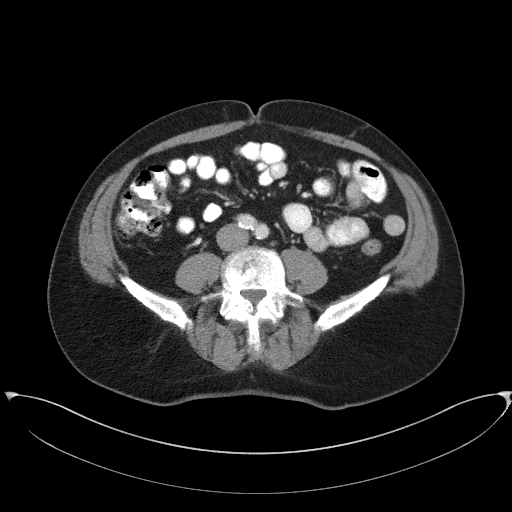
[im 71/101  soft-tissue]
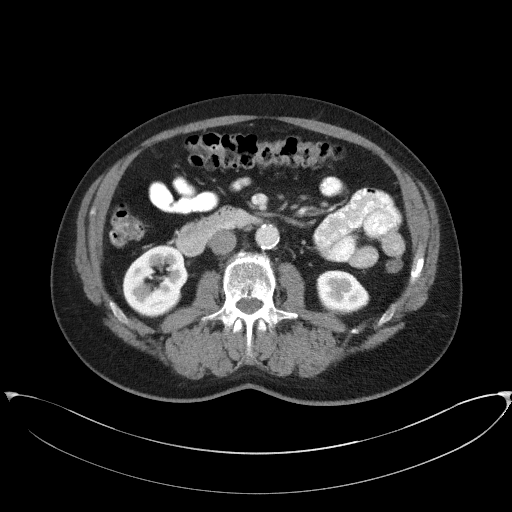
[im 71/101  bone]
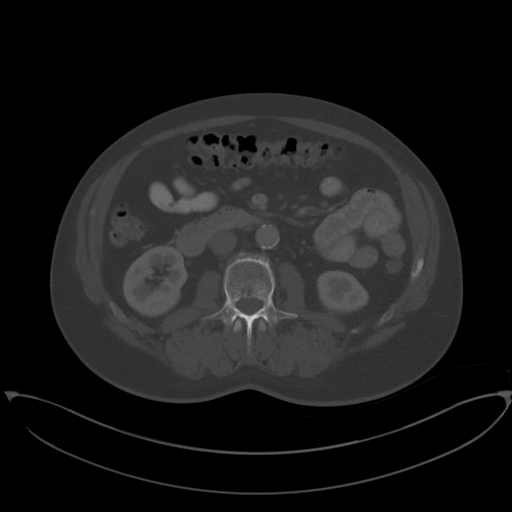
[im 81/101  soft-tissue]
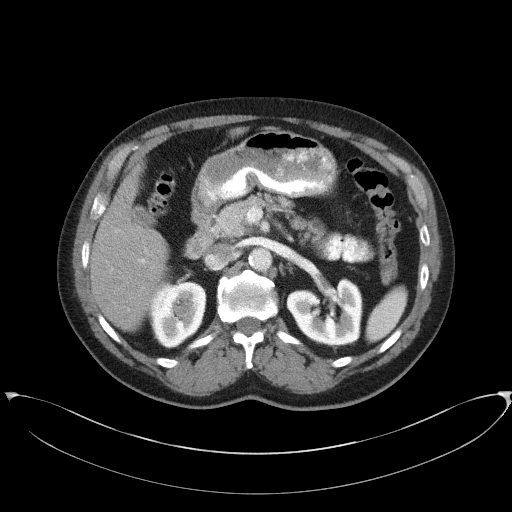
[im 86/101  soft-tissue]
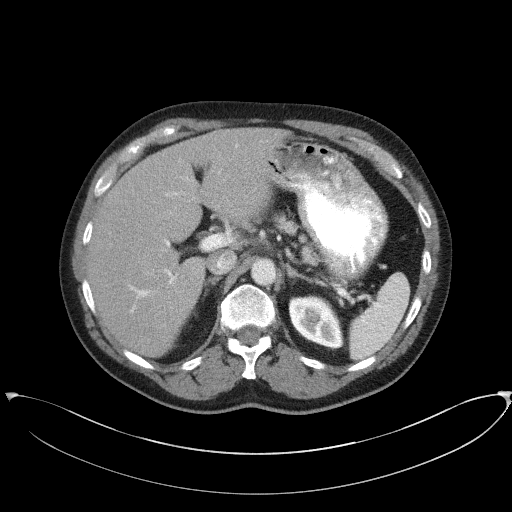
[im 96/101  soft-tissue]
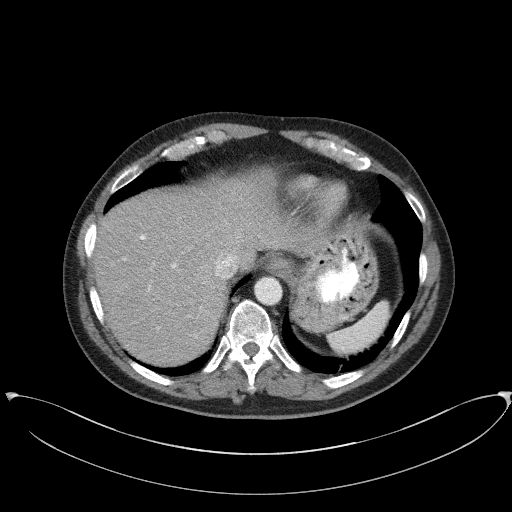

[Series 4: coronal st · coronal · 0.83mm/px · 3 of 100 slices shown]
[im 34/100  soft-tissue]
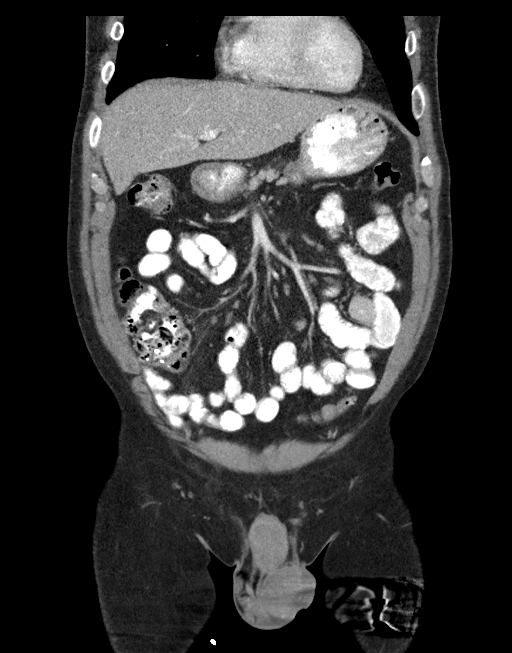
[im 45/100  soft-tissue]
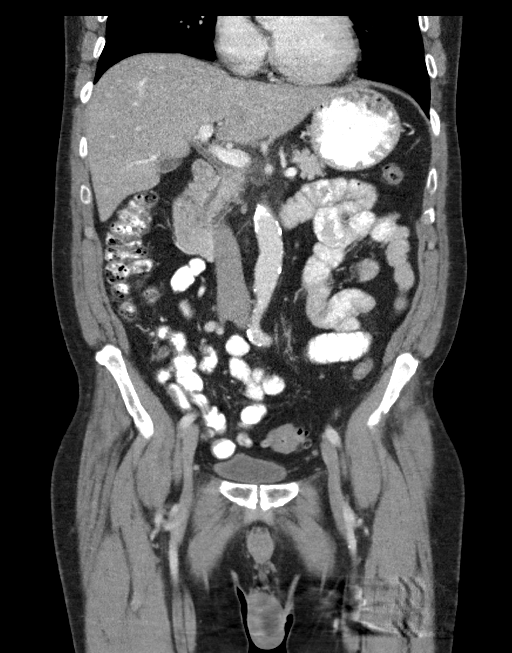
[im 56/100  soft-tissue]
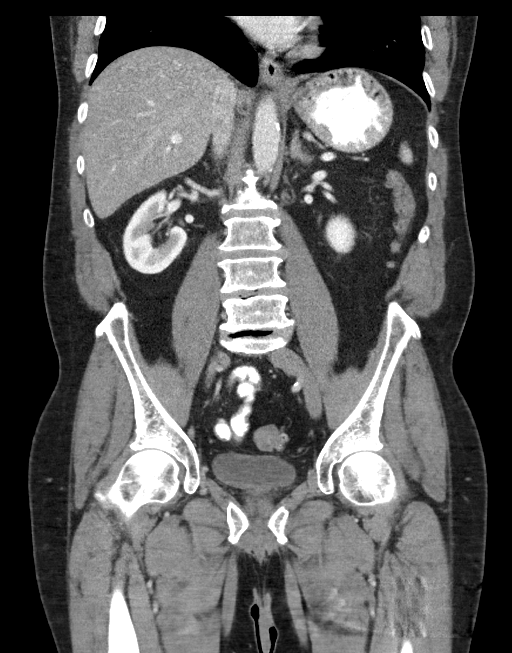

[15 of 46 positions shown; findings below may reference images not displayed]

FINDINGS: Lower chest: Mild dependent atelectasis at both lung bases. No
significant pleural or pericardial effusion.

Hepatobiliary: The liver appears normal without abnormal enhancement
or focal lesion. The gallbladder is incompletely distended. No
evidence of gallstones, gallbladder wall thickening or biliary
dilatation.

Pancreas: Unremarkable. No pancreatic ductal dilatation or
surrounding inflammatory changes.

Spleen: Normal in size without focal abnormality.

Adrenals/Urinary Tract: Both adrenal glands appear normal.
Subcentimeter low-density lesion in the upper pole of the left
kidney is too small to characterize, although is likely a cyst based
on its appearance on the reformatted images. The kidneys otherwise
appear normal. There is no evidence of urinary tract calculus or
hydronephrosis. The bladder appears normal.

Stomach/Bowel: The stomach, small bowel and proximal colon appear
normal. There are diverticular changes throughout the descending and
sigmoid colon. There is mild sigmoid colon wall thickening, but no
surrounding inflammation. No evidence of bowel obstruction.

Vascular/Lymphatic: There are no enlarged abdominal or pelvic lymph
nodes. Moderate aortic and branch vessel atherosclerosis. No acute
vascular findings.

Reproductive: The prostate gland and seminal vesicles demonstrate no
significant findings.

Other: No evidence of abdominal wall mass or hernia. No ascites.

Musculoskeletal: No acute osseous findings. There are bilateral L5
pars defects with a resulting grade 1 anterolisthesis and
right-greater-than-left foraminal narrowing at L5-S1.
IMPRESSION: 1. No acute findings or explanation for the patient's symptoms.
2. Colonic diverticulosis with mild sigmoid colon wall thickening.
No evidence of acute inflammation or obstruction.
3.  Aortic Atherosclerosis ([id number]).
4. Bilateral L5 pars defects with grade 1 anterolisthesis and
right-greater-than-left foraminal narrowing at L5-S1.

## 2019-06-22 ENCOUNTER — Other Ambulatory Visit: Payer: Self-pay

## 2019-06-22 ENCOUNTER — Ambulatory Visit (INDEPENDENT_AMBULATORY_CARE_PROVIDER_SITE_OTHER): Payer: Medicare Other | Admitting: Family Medicine

## 2019-06-22 ENCOUNTER — Encounter: Payer: Self-pay | Admitting: Family Medicine

## 2019-06-22 DIAGNOSIS — Z8673 Personal history of transient ischemic attack (TIA), and cerebral infarction without residual deficits: Secondary | ICD-10-CM

## 2019-06-22 DIAGNOSIS — F17200 Nicotine dependence, unspecified, uncomplicated: Secondary | ICD-10-CM

## 2019-06-22 DIAGNOSIS — E782 Mixed hyperlipidemia: Secondary | ICD-10-CM | POA: Diagnosis not present

## 2019-06-22 DIAGNOSIS — I10 Essential (primary) hypertension: Secondary | ICD-10-CM | POA: Diagnosis not present

## 2019-06-22 NOTE — Patient Instructions (Addendum)
Great to meet you! Please continue current medications.  See me again in about 5-6 months.

## 2019-06-22 NOTE — Assessment & Plan Note (Signed)
Stable without significant deficits.   Continue full strength asa and statin.

## 2019-06-22 NOTE — Progress Notes (Signed)
Jeremy Miller - 69 y.o. male MRN [id number]  Date of birth: Sep 19, 1950  Subjective Chief Complaint  Patient presents with  . Hypertension    HPI Jeremy Miller is a 69 y.o. male with history of HTN, prior CVA, prediabetes, HLD and nicotine dependence here today for follow up visit.    -HTN:  Current management with lisinopril and hctz.  He is doing well with current medications. He has history of CVA as well no significant residual deficits.  He does continue on ASA.   He denies current symptoms related to HTN including chest pain, shortness of breath, palpitations, headache or vision changes.    -HLD:  Current management with atorvastatin.  Last LDL 85. He is tolerating lipitor at current dose, denies myalgias.  -Nicotine dependence:   Current daily smoker, 1/2-1ppd.  He has tried quitting in the past and been unsuccessful.  Feels that he is not ready to quit at this time.   ROS:  A comprehensive ROS was completed and negative except as noted per HPI  No Known Allergies  Past Medical History:  Diagnosis Date  . 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 HTN (hypertension) 03/27/2015  . Prediabetes 03/29/2015  . Stroke [facility])     Past Surgical History:  Procedure Laterality Date  . APPENDECTOMY    . HAND SURGERY    . NOSE SURGERY    . VASECTOMY      Social History   Socioeconomic History  . Marital status: Divorced    Spouse name: Not on file  . Number of children: Not on file  . Years of education: Not on file  . Highest education level: Not on file  Occupational History  . Not on file  Tobacco Use  . Smoking status: Current Every Day Smoker  . Smokeless tobacco: Never Used  Substance and Sexual Activity  . Alcohol use: No    Alcohol/week: 0.0 standard drinks  . Drug use: No  . Sexual activity: Not on file  Other Topics Concern  . Not on file  Social History Narrative  . Not on file   Social Determinants of Health   Financial Resource  Strain:   . Difficulty of Paying Living Expenses:   Food Insecurity:   . Worried About Charity fundraiser in the Last Year:   . Arboriculturist in the Last Year:   Transportation Needs:   . Film/video editor (Medical):   Marland Kitchen Lack of Transportation (Non-Medical):   Physical Activity:   . Days of Exercise per Week:   . Minutes of Exercise per Session:   Stress:   . Feeling of Stress :   Social Connections:   . Frequency of Communication with Friends and Family:   . Frequency of Social Gatherings with Friends and Family:   . Attends Religious Services:   . Active Member of Clubs or Organizations:   . Attends Archivist Meetings:   Marland Kitchen Marital Status:     Family History  Problem Relation Age of Onset  . Diabetes Mother   . Hypertension Mother   . Hyperlipidemia Mother   . Narcolepsy Mother   . Stroke Mother   . ALS Sister   . Stroke Sister     Health Maintenance  Topic Date Due  . INFLUENZA VACCINE  10/09/2019  . COLONOSCOPY  08/01/2020  . TETANUS/TDAP  03/26/2025  . Hepatitis C Screening  Completed  . PNA vac Low Risk Adult  Completed     -----------------------------------------------------------------------------------------------------------------------------------------------------------------------------------------------------------------  Physical Exam BP 124/74   Pulse 69   Temp 97.7 F (36.5 C) (Oral)   Ht 5\' 6"  (1.676 m)   Wt 175 lb (79.4 kg)   BMI 28.25 kg/m   Physical Exam Constitutional:      Appearance: Normal appearance.  HENT:     Head: Normocephalic and atraumatic.  Eyes:     General: No scleral icterus. Cardiovascular:     Rate and Rhythm: Normal rate and regular rhythm.  Pulmonary:     Effort: Pulmonary effort is normal.     Breath sounds: Normal breath sounds.  Musculoskeletal:     Cervical back: Neck supple.  Skin:    General: Skin is warm and dry.  Neurological:     Mental Status: He is alert.  Psychiatric:         Mood and Affect: Mood normal.     ------------------------------------------------------------------------------------------------------------------------------------------------------------------------------------------------------------------- Assessment and Plan  HTN (hypertension) Blood pressure is at goal at for age and co-morbidities.  I recommend he continue lisinopril and hctz at current strength.  In addition they were instructed to follow a low sodium diet with regular exercise to help to maintain adequate control of blood pressure.    History of CVA (cerebrovascular accident) Stable without significant deficits.   Continue full strength asa and statin.    HLD (hyperlipidemia) Stable, doing well with atorvastatin.  Continue at current dose.   Smokes Counseled on smoking cessation, no interest in trying to quit at this time.    No orders of the defined types were placed in this encounter.   Return in about 5 months (around 11/22/2019) for Follow up HTN/update labs.    This visit occurred during the SARS-CoV-2 public health emergency.  Safety protocols were in place, including screening questions prior to the visit, additional usage of staff PPE, and extensive cleaning of exam room while observing appropriate contact time as indicated for disinfecting solutions.

## 2019-06-22 NOTE — Assessment & Plan Note (Signed)
Stable, doing well with atorvastatin.  Continue at current dose.

## 2019-06-22 NOTE — Assessment & Plan Note (Signed)
Counseled on smoking cessation, no interest in trying to quit at this time.

## 2019-06-22 NOTE — Assessment & Plan Note (Signed)
Blood pressure is at goal at for age and co-morbidities.  I recommend he continue lisinopril and hctz at current strength.  In addition they were instructed to follow a low sodium diet with regular exercise to help to maintain adequate control of blood pressure.

## 2019-08-25 ENCOUNTER — Other Ambulatory Visit: Payer: Self-pay | Admitting: Family Medicine

## 2019-11-04 DIAGNOSIS — M9904 Segmental and somatic dysfunction of sacral region: Secondary | ICD-10-CM | POA: Diagnosis not present

## 2019-11-04 DIAGNOSIS — M9903 Segmental and somatic dysfunction of lumbar region: Secondary | ICD-10-CM | POA: Diagnosis not present

## 2019-11-04 DIAGNOSIS — M5126 Other intervertebral disc displacement, lumbar region: Secondary | ICD-10-CM | POA: Diagnosis not present

## 2019-11-04 DIAGNOSIS — M9905 Segmental and somatic dysfunction of pelvic region: Secondary | ICD-10-CM | POA: Diagnosis not present

## 2019-11-22 ENCOUNTER — Ambulatory Visit: Payer: Medicare Other | Admitting: Family Medicine

## 2019-11-23 DIAGNOSIS — M5126 Other intervertebral disc displacement, lumbar region: Secondary | ICD-10-CM | POA: Diagnosis not present

## 2019-11-23 DIAGNOSIS — M9904 Segmental and somatic dysfunction of sacral region: Secondary | ICD-10-CM | POA: Diagnosis not present

## 2019-11-23 DIAGNOSIS — M9903 Segmental and somatic dysfunction of lumbar region: Secondary | ICD-10-CM | POA: Diagnosis not present

## 2019-11-23 DIAGNOSIS — M9905 Segmental and somatic dysfunction of pelvic region: Secondary | ICD-10-CM | POA: Diagnosis not present

## 2019-11-25 ENCOUNTER — Other Ambulatory Visit: Payer: Self-pay

## 2019-11-25 MED ORDER — HYDROCHLOROTHIAZIDE 25 MG PO TABS: 25.0000 mg | ORAL_TABLET | Freq: Every day | ORAL | 0 refills | Status: DC

## 2019-11-28 ENCOUNTER — Other Ambulatory Visit: Payer: Self-pay

## 2019-11-28 MED ORDER — OMEPRAZOLE 40 MG PO CPDR: 40.0000 mg | DELAYED_RELEASE_CAPSULE | Freq: Every day | ORAL | 3 refills | Status: DC

## 2019-11-30 DIAGNOSIS — M9903 Segmental and somatic dysfunction of lumbar region: Secondary | ICD-10-CM | POA: Diagnosis not present

## 2019-11-30 DIAGNOSIS — M5126 Other intervertebral disc displacement, lumbar region: Secondary | ICD-10-CM | POA: Diagnosis not present

## 2019-11-30 DIAGNOSIS — M9905 Segmental and somatic dysfunction of pelvic region: Secondary | ICD-10-CM | POA: Diagnosis not present

## 2019-11-30 DIAGNOSIS — M9904 Segmental and somatic dysfunction of sacral region: Secondary | ICD-10-CM | POA: Diagnosis not present

## 2019-12-07 DIAGNOSIS — Z23 Encounter for immunization: Secondary | ICD-10-CM | POA: Diagnosis not present

## 2019-12-12 ENCOUNTER — Other Ambulatory Visit: Payer: Self-pay

## 2019-12-13 DIAGNOSIS — Z23 Encounter for immunization: Secondary | ICD-10-CM | POA: Diagnosis not present

## 2019-12-27 ENCOUNTER — Ambulatory Visit: Payer: Medicare Other | Admitting: Family Medicine

## 2019-12-28 ENCOUNTER — Ambulatory Visit (INDEPENDENT_AMBULATORY_CARE_PROVIDER_SITE_OTHER): Payer: Medicare Other | Admitting: Family Medicine

## 2019-12-28 ENCOUNTER — Encounter: Payer: Self-pay | Admitting: Family Medicine

## 2019-12-28 ENCOUNTER — Other Ambulatory Visit: Payer: Self-pay

## 2019-12-28 VITALS — BP 112/61 | HR 69 | Temp 98.3°F | Wt 170.0 lb

## 2019-12-28 DIAGNOSIS — E782 Mixed hyperlipidemia: Secondary | ICD-10-CM

## 2019-12-28 DIAGNOSIS — F17210 Nicotine dependence, cigarettes, uncomplicated: Secondary | ICD-10-CM

## 2019-12-28 DIAGNOSIS — I10 Essential (primary) hypertension: Secondary | ICD-10-CM | POA: Diagnosis not present

## 2019-12-28 DIAGNOSIS — R0989 Other specified symptoms and signs involving the circulatory and respiratory systems: Secondary | ICD-10-CM | POA: Insufficient documentation

## 2019-12-28 DIAGNOSIS — H6120 Impacted cerumen, unspecified ear: Secondary | ICD-10-CM | POA: Insufficient documentation

## 2019-12-28 DIAGNOSIS — Z122 Encounter for screening for malignant neoplasm of respiratory organs: Secondary | ICD-10-CM | POA: Diagnosis not present

## 2019-12-28 DIAGNOSIS — R7303 Prediabetes: Secondary | ICD-10-CM | POA: Diagnosis not present

## 2019-12-28 DIAGNOSIS — H6123 Impacted cerumen, bilateral: Secondary | ICD-10-CM

## 2019-12-28 DIAGNOSIS — Z125 Encounter for screening for malignant neoplasm of prostate: Secondary | ICD-10-CM | POA: Diagnosis not present

## 2019-12-28 DIAGNOSIS — Z8673 Personal history of transient ischemic attack (TIA), and cerebral infarction without residual deficits: Secondary | ICD-10-CM | POA: Diagnosis not present

## 2019-12-28 DIAGNOSIS — F17200 Nicotine dependence, unspecified, uncomplicated: Secondary | ICD-10-CM | POA: Diagnosis not present

## 2019-12-28 MED ORDER — NITROGLYCERIN 0.4 MG SL SUBL: 0.4000 mg | SUBLINGUAL_TABLET | SUBLINGUAL | 1 refills | Status: DC | PRN

## 2019-12-28 MED ORDER — BUDESONIDE-FORMOTEROL FUMARATE 160-4.5 MCG/ACT IN AERO
2.0000 | INHALATION_SPRAY | Freq: Two times a day (BID) | RESPIRATORY_TRACT | 3 refills | Status: DC
Start: 2019-12-28 — End: 2020-06-27

## 2019-12-28 MED ORDER — ALBUTEROL SULFATE HFA 108 (90 BASE) MCG/ACT IN AERS: 2.0000 | INHALATION_SPRAY | Freq: Four times a day (QID) | RESPIRATORY_TRACT | 2 refills | Status: DC | PRN

## 2019-12-28 NOTE — Assessment & Plan Note (Signed)
No residual deficits.  Continue asa and statin.  Update lipid panel.

## 2019-12-28 NOTE — Progress Notes (Signed)
Jeremy Miller - 69 y.o. male MRN [id number]  Date of birth: 1950-12-27  Subjective Chief Complaint  Patient presents with  . Hypertension    HPI Jeremy Miller is a 69 y.o. male here today for follow up visit.  He has history of HTN, prediabetes, previous stroke and nicotine use.   He has felt some fullness in his ears and thinks these need to be flushed out.    HTN currently managed with lisinopril 20mg  and HCTZ 25mg  daily.  He is doing well with current medications.  He denies side effects related to current medication use.  He has not had symptoms related to HTN including chest pain, shortness of breath, palpitations, headache or vision changes.   History of previous CVA.  No significant residual deficits from this.  He remains on statin and asa daily.   He does report some increased issues with chest congestion and cough.  He feels a little more winded with activity and notices occasional wheezing.  He has albuterol inhaler but usually doesn't use this and is not sure how old this is.  He has been an 0.75-1ppd smoker for 50 years.  He has never had lung cancer screening.   ROS:  A comprehensive ROS was completed and negative except as noted per HPI  No Known Allergies  Past Medical History:  Diagnosis Date  . 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 HTN (hypertension) 03/27/2015  . Prediabetes 03/29/2015  . Stroke [facility])     Past Surgical History:  Procedure Laterality Date  . APPENDECTOMY    . HAND SURGERY    . NOSE SURGERY    . VASECTOMY      Social History   Socioeconomic History  . Marital status: Divorced    Spouse name: Not on file  . Number of children: Not on file  . Years of education: Not on file  . Highest education level: Not on file  Occupational History  . Not on file  Tobacco Use  . Smoking status: Current Every Day Smoker  . Smokeless tobacco: Never Used  Substance and Sexual Activity  . Alcohol use: No    Alcohol/week: 0.0  standard drinks  . Drug use: No  . Sexual activity: Not on file  Other Topics Concern  . Not on file  Social History Narrative  . Not on file   Social Determinants of Health   Financial Resource Strain:   . Difficulty of Paying Living Expenses: Not on file  Food Insecurity:   . Worried About Charity fundraiser in the Last Year: Not on file  . Ran Out of Food in the Last Year: Not on file  Transportation Needs:   . Lack of Transportation (Medical): Not on file  . Lack of Transportation (Non-Medical): Not on file  Physical Activity:   . Days of Exercise per Week: Not on file  . Minutes of Exercise per Session: Not on file  Stress:   . Feeling of Stress : Not on file  Social Connections:   . Frequency of Communication with Friends and Family: Not on file  . Frequency of Social Gatherings with Friends and Family: Not on file  . Attends Religious Services: Not on file  . Active Member of Clubs or Organizations: Not on file  . Attends Archivist Meetings: Not on file  . Marital Status: Not on file    Family History  Problem Relation Age of Onset  . Diabetes Mother   .  Hypertension Mother   . Hyperlipidemia Mother   . Narcolepsy Mother   . Stroke Mother   . ALS Sister   . Stroke Sister     Health Maintenance  Topic Date Due  . COVID-19 Vaccine (1) Never done  . INFLUENZA VACCINE  10/09/2019  . COLONOSCOPY  08/01/2020  . TETANUS/TDAP  03/26/2025  . Hepatitis C Screening  Completed  . PNA vac Low Risk Adult  Completed     ----------------------------------------------------------------------------------------------------------------------------------------------------------------------------------------------------------------- Physical Exam BP 112/61 (BP Location: Left Arm, Patient Position: Sitting, Cuff Size: Normal)   Pulse 69   Temp 98.3 F (36.8 C)   Wt 170 lb (77.1 kg)   SpO2 98%   BMI 27.44 kg/m   Physical Exam Constitutional:       Appearance: Normal appearance.  HENT:     Head: Normocephalic and atraumatic.     Right Ear: There is impacted cerumen.     Left Ear: There is impacted cerumen.  Cardiovascular:     Rate and Rhythm: Normal rate and regular rhythm.     Heart sounds: Normal heart sounds.  Pulmonary:     Effort: Pulmonary effort is normal.     Breath sounds: Wheezing (mild expiratory wheezes. ) present. No rhonchi.  Neurological:     General: No focal deficit present.     Mental Status: He is alert.  Psychiatric:        Mood and Affect: Mood normal.        Behavior: Behavior normal.     ------------------------------------------------------------------------------------------------------------------------------------------------------------------------------------------------------------------- Assessment and Plan  Nicotine dependence He was counseled on smoking cessation but has no interest in quitting at this time.    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  Will ad symbicort daily.  Continue albuterol as needed.  Recommend smoking cessation.  Lung cancer screening recommended, referral placed.   HTN (hypertension) Blood pressure is at goal at for age and co-morbidities.  I recommend continuation of lisinopril and HCTZ at current strength.  In addition they were instructed to follow a low sodium diet with regular exercise to help to maintain adequate control of blood pressure.    Prediabetes Update a1c today.   History of CVA (cerebrovascular accident) No residual deficits.  Continue asa and statin.  Update lipid panel.   Cerumen impaction Bilateral ear lavage completed successfully today with improvement in symtposm.    Meds ordered this encounter  Medications  . budesonide-formoterol (SYMBICORT) 160-4.5 MCG/ACT inhaler    Sig: Inhale 2 puffs into the lungs 2 (two) times daily.    Dispense:  1 each    Refill:  3  . albuterol (VENTOLIN HFA) 108 (90  Base) MCG/ACT inhaler    Sig: Inhale 2 puffs into the lungs every 6 (six) hours as needed for wheezing or shortness of breath.    Dispense:  8 g    Refill:  2  . nitroGLYCERIN (NITROSTAT) 0.4 MG SL tablet    Sig: Place 1 tablet (0.4 mg total) under the tongue every 5 (five) minutes as needed for chest pain (x 3 pills). Reported on 04/23/2015    Dispense:  25 tablet    Refill:  1   Orders Placed This Encounter  Procedures  . COMPLETE METABOLIC PANEL WITH GFR  . CBC  . Direct LDL  . HgB A1c  . Ambulatory Referral for Lung Cancer Scre    Referral Priority:   Routine    Referral Type:   Consultation    Referral  Reason:   Specialty Services Required    Number of Visits Requested:   1    Return in about 6 months (around 06/27/2020) for HTN.    This visit occurred during the SARS-CoV-2 public health emergency.  Safety protocols were in place, including screening questions prior to the visit, additional usage of staff PPE, and extensive cleaning of exam room while observing appropriate contact time as indicated for disinfecting solutions.

## 2019-12-28 NOTE — Assessment & Plan Note (Signed)
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  Will ad symbicort daily.  Continue albuterol as needed.  Recommend smoking cessation.  Lung cancer screening recommended, referral placed.

## 2019-12-28 NOTE — Assessment & Plan Note (Signed)
Bilateral ear lavage completed successfully today with improvement in symtposm.

## 2019-12-28 NOTE — Telephone Encounter (Signed)
Referral already placed to cancer center to schedule screening.

## 2019-12-28 NOTE — Assessment & Plan Note (Signed)
Blood pressure is at goal at for age and co-morbidities.  I recommend continuation of lisinopril and HCTZ at current strength.  In addition they were instructed to follow a low sodium diet with regular exercise to help to maintain adequate control of blood pressure.

## 2019-12-28 NOTE — Patient Instructions (Addendum)
Use symbicort every day.  Use albuterol as needed for cough, shortness of breath or wheezing.  Think about having lung cancer screening.  Have labs completed and see me again in 6 months.

## 2019-12-28 NOTE — Assessment & Plan Note (Signed)
He was counseled on smoking cessation but has no interest in quitting at this time.

## 2019-12-28 NOTE — Assessment & Plan Note (Signed)
Update a1c today . 

## 2019-12-28 NOTE — Telephone Encounter (Signed)
Pended low dose CT lung cancer screening with note that

## 2019-12-29 ENCOUNTER — Encounter: Payer: Self-pay | Admitting: *Deleted

## 2019-12-29 ENCOUNTER — Telehealth: Payer: Self-pay | Admitting: *Deleted

## 2019-12-29 ENCOUNTER — Encounter: Payer: Self-pay | Admitting: Family Medicine

## 2019-12-29 DIAGNOSIS — Z122 Encounter for screening for malignant neoplasm of respiratory organs: Secondary | ICD-10-CM

## 2019-12-29 DIAGNOSIS — Z87891 Personal history of nicotine dependence: Secondary | ICD-10-CM

## 2019-12-29 LAB — COMPLETE METABOLIC PANEL WITH GFR
AG Ratio: 1.7 (calc) (ref 1.0–2.5)
ALT: 16 U/L (ref 9–46)
AST: 14 U/L (ref 10–35)
Albumin: 4.5 g/dL (ref 3.6–5.1)
Alkaline phosphatase (APISO): 78 U/L (ref 35–144)
BUN: 14 mg/dL (ref 7–25)
CO2: 28 mmol/L (ref 20–32)
Calcium: 10.1 mg/dL (ref 8.6–10.3)
Chloride: 103 mmol/L (ref 98–110)
Creat: 0.98 mg/dL (ref 0.70–1.25)
GFR, Est African American: 91 mL/min/{1.73_m2} (ref >=60)
GFR, Est Non African American: 78 mL/min/{1.73_m2} (ref >=60)
Globulin: 2.6 g/dL (calc) (ref 1.9–3.7)
Glucose, Bld: 99 mg/dL (ref 65–139)
Potassium: 4.9 mmol/L (ref 3.5–5.3)
Sodium: 140 mmol/L (ref 135–146)
Total Bilirubin: 0.5 mg/dL (ref 0.2–1.2)
Total Protein: 7.1 g/dL (ref 6.1–8.1)

## 2019-12-29 LAB — CBC
HCT: 44.2 % (ref 38.5–50.0)
Hemoglobin: 14.9 g/dL (ref 13.2–17.1)
MCH: 30.5 pg (ref 27.0–33.0)
MCHC: 33.7 g/dL (ref 32.0–36.0)
MCV: 90.6 fL (ref 80.0–100.0)
MPV: 9.6 fL (ref 7.5–12.5)
Platelets: 387 10*3/uL (ref 140–400)
RBC: 4.88 10*6/uL (ref 4.20–5.80)
RDW: 12.3 % (ref 11.0–15.0)
WBC: 9.2 10*3/uL (ref 3.8–10.8)

## 2019-12-29 LAB — HEMOGLOBIN A1C
Hgb A1c MFr Bld: 5.9 % of total Hgb — ABNORMAL HIGH (ref <5.7)
Mean Plasma Glucose: 123 (calc)
eAG (mmol/L): 6.8 (calc)

## 2019-12-29 LAB — LDL CHOLESTEROL, DIRECT: Direct LDL: 85 mg/dL (ref <100)

## 2019-12-29 NOTE — Telephone Encounter (Signed)
Patient is scheduled for 01/18/20. Thank you for the referral.

## 2019-12-29 NOTE — Telephone Encounter (Signed)
Received referral for initial lung cancer screening scan. Contacted patient and obtained smoking history,(current 98 pack year) as well as answering questions related to screening process. Patient denies signs of lung cancer such as weight loss or hemoptysis. Patient denies comorbidity that would prevent curative treatment if lung cancer were found. Patient is scheduled for shared decision making visit and CT scan on 01/18/20.

## 2020-01-02 ENCOUNTER — Other Ambulatory Visit: Payer: Self-pay

## 2020-01-02 ENCOUNTER — Encounter: Payer: Self-pay | Admitting: Family Medicine

## 2020-01-02 ENCOUNTER — Other Ambulatory Visit: Payer: Self-pay | Admitting: Family Medicine

## 2020-01-02 DIAGNOSIS — F17200 Nicotine dependence, unspecified, uncomplicated: Secondary | ICD-10-CM

## 2020-01-02 MED ORDER — HYDROCHLOROTHIAZIDE 25 MG PO TABS
25.0000 mg | ORAL_TABLET | Freq: Every day | ORAL | 3 refills | Status: DC
Start: 2020-01-02 — End: 2020-01-13

## 2020-01-13 ENCOUNTER — Other Ambulatory Visit: Payer: Self-pay | Admitting: Family Medicine

## 2020-01-18 ENCOUNTER — Ambulatory Visit (HOSPITAL_BASED_OUTPATIENT_CLINIC_OR_DEPARTMENT_OTHER)
Admission: RE | Admit: 2020-01-18 | Discharge: 2020-01-18 | Disposition: A | Discharge status: Home / Self Care | Payer: Medicare Other | Source: Ambulatory Visit | Attending: Oncology | Admitting: Oncology

## 2020-01-18 ENCOUNTER — Telehealth: Payer: Medicare Other | Admitting: Oncology

## 2020-01-18 ENCOUNTER — Other Ambulatory Visit: Payer: Self-pay

## 2020-01-18 DIAGNOSIS — F17210 Nicotine dependence, cigarettes, uncomplicated: Secondary | ICD-10-CM | POA: Diagnosis not present

## 2020-01-18 DIAGNOSIS — Z122 Encounter for screening for malignant neoplasm of respiratory organs: Secondary | ICD-10-CM | POA: Insufficient documentation

## 2020-01-18 DIAGNOSIS — Z87891 Personal history of nicotine dependence: Secondary | ICD-10-CM | POA: Insufficient documentation

## 2020-01-19 ENCOUNTER — Ambulatory Visit: Payer: Medicare Other

## 2020-01-24 ENCOUNTER — Encounter: Payer: Self-pay | Admitting: Family Medicine

## 2020-02-10 ENCOUNTER — Encounter: Payer: Self-pay | Admitting: Family Medicine

## 2020-02-21 ENCOUNTER — Other Ambulatory Visit: Payer: Self-pay | Admitting: Sports Medicine

## 2020-02-21 DIAGNOSIS — I119 Hypertensive heart disease without heart failure: Secondary | ICD-10-CM

## 2020-02-21 MED ORDER — LISINOPRIL 20 MG PO TABS
20.0000 mg | ORAL_TABLET | Freq: Every day | ORAL | 1 refills | Status: DC
Start: 2020-02-21 — End: 2020-10-24

## 2020-02-21 MED ORDER — ATORVASTATIN CALCIUM 40 MG PO TABS: 40.0000 mg | ORAL_TABLET | Freq: Every day | ORAL | 3 refills | Status: DC

## 2020-02-23 ENCOUNTER — Other Ambulatory Visit: Payer: Self-pay

## 2020-02-23 DIAGNOSIS — I119 Hypertensive heart disease without heart failure: Secondary | ICD-10-CM

## 2020-06-27 ENCOUNTER — Other Ambulatory Visit: Payer: Self-pay

## 2020-06-27 ENCOUNTER — Encounter: Payer: Self-pay | Admitting: Family Medicine

## 2020-06-27 ENCOUNTER — Ambulatory Visit (INDEPENDENT_AMBULATORY_CARE_PROVIDER_SITE_OTHER): Payer: Medicare Other | Admitting: Family Medicine

## 2020-06-27 DIAGNOSIS — F17210 Nicotine dependence, cigarettes, uncomplicated: Secondary | ICD-10-CM

## 2020-06-27 DIAGNOSIS — K219 Gastro-esophageal reflux disease without esophagitis: Secondary | ICD-10-CM

## 2020-06-27 DIAGNOSIS — E782 Mixed hyperlipidemia: Secondary | ICD-10-CM

## 2020-06-27 DIAGNOSIS — I10 Essential (primary) hypertension: Secondary | ICD-10-CM

## 2020-06-27 NOTE — Assessment & Plan Note (Signed)
He is doing well with atorvastatin.  Continue at current strength.

## 2020-06-27 NOTE — Progress Notes (Signed)
Jeremy Miller - 69 y.o. male MRN [id number]  Date of birth: Jul 28, 1950  Subjective Chief Complaint  Patient presents with  . Hypertension    HPI Jeremy Miller returns today for follow up of HTN, HLD and prediabetes. He reports that he is doing well.  Some increased stress due to some expensive improvements that he is needing to make on property he owns.  He is smoking a little more due to this.  He has no interest in trying to quit right now.    HTN is managed with lisinopril and hctz.  He is tolerating these well and denies side effects related to medications.  He is without chest pain, shortness of breath, palpitations, headache or vision changes.   He continues on atorvastatin for history of HLD and remote history of stroke.  He is tolerating this well.    Omeprazole continues to control reflux symptoms well.   ROS:  A comprehensive ROS was completed and negative except as noted per HPI  No Known Allergies  Past Medical History:  Diagnosis Date  . 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 HTN (hypertension) 03/27/2015  . Prediabetes 03/29/2015  . Stroke [facility])     Past Surgical History:  Procedure Laterality Date  . APPENDECTOMY    . HAND SURGERY    . NOSE SURGERY    . VASECTOMY      Social History   Socioeconomic History  . Marital status: Divorced    Spouse name: Not on file  . Number of children: Not on file  . Years of education: Not on file  . Highest education level: Not on file  Occupational History  . Not on file  Tobacco Use  . Smoking status: Current Every Day Smoker    Packs/day: 2.00    Years: 49.00    Pack years: 98.00    Types: Cigarettes  . Smokeless tobacco: Never Used  . Tobacco comment: 1ppd currently  Substance and Sexual Activity  . Alcohol use: No    Alcohol/week: 0.0 standard drinks  . Drug use: No  . Sexual activity: Not on file  Other Topics Concern  . Not on file  Social History Narrative  . Not on file   Social  Determinants of Health   Financial Resource Strain: Not on file  Food Insecurity: Not on file  Transportation Needs: Not on file  Physical Activity: Not on file  Stress: Not on file  Social Connections: Not on file    Family History  Problem Relation Age of Onset  . Diabetes Mother   . Hypertension Mother   . Hyperlipidemia Mother   . Narcolepsy Mother   . Stroke Mother   . ALS Sister   . Stroke Sister     Health Maintenance  Topic Date Due  . COLONOSCOPY (Pts 45-55yrs Insurance coverage will need to be confirmed)  08/01/2020  . INFLUENZA VACCINE  10/08/2020  . TETANUS/TDAP  03/26/2025  . COVID-19 Vaccine  Completed  . Hepatitis C Screening  Completed  . PNA vac Low Risk Adult  Completed  . HPV VACCINES  Aged Out     ----------------------------------------------------------------------------------------------------------------------------------------------------------------------------------------------------------------- Physical Exam BP 132/65 (BP Location: Left Arm, Patient Position: Sitting, Cuff Size: Normal)   Pulse 78   Temp 97.9 F (36.6 C)   Ht 5\' 6"  (1.676 m)   Wt 171 lb (77.6 kg)   SpO2 99%   BMI 27.60 kg/m   Physical Exam Constitutional:      Appearance: Normal appearance.  HENT:     Right Ear: Tympanic membrane and ear canal normal.     Left Ear: Tympanic membrane and ear canal normal.  Cardiovascular:     Rate and Rhythm: Normal rate and regular rhythm.  Pulmonary:     Effort: Pulmonary effort is normal.     Breath sounds: Normal breath sounds.  Musculoskeletal:     Cervical back: Neck supple.  Neurological:     General: No focal deficit present.     Mental Status: He is alert.  Psychiatric:        Mood and Affect: Mood normal.        Behavior: Behavior normal.      ------------------------------------------------------------------------------------------------------------------------------------------------------------------------------------------------------------------- Assessment and Plan  HTN (hypertension) Blood pressure is at goal at for age and co-morbidities.  I recommend continuation of current medications.  In addition they were instructed to follow a low sodium diet with regular exercise to help to maintain adequate control of blood pressure.    Nicotine dependence Smoking has increased some recently, counseled on cutting back with goal of quitting.   HLD (hyperlipidemia) He is doing well with atorvastatin.  Continue at current strength.   GERD (gastroesophageal reflux disease) Well controlled with omeprazole.  Will continue.    No orders of the defined types were placed in this encounter.   Return in about 6 months (around 12/27/2020) for HTN.    This visit occurred during the SARS-CoV-2 public health emergency.  Safety protocols were in place, including screening questions prior to the visit, additional usage of staff PPE, and extensive cleaning of exam room while observing appropriate contact time as indicated for disinfecting solutions.

## 2020-06-27 NOTE — Assessment & Plan Note (Signed)
Smoking has increased some recently, counseled on cutting back with goal of quitting.

## 2020-06-27 NOTE — Assessment & Plan Note (Signed)
Well controlled with omeprazole.  Will continue.

## 2020-06-27 NOTE — Patient Instructions (Signed)
Great to see you! Continue current medications.  See me again in 6 months.  We'll check labs at this appointment.

## 2020-06-27 NOTE — Assessment & Plan Note (Signed)
Blood pressure is at goal at for age and co-morbidities.  I recommend continuation of current medications.  In addition they were instructed to follow a low sodium diet with regular exercise to help to maintain adequate control of blood pressure.  ? ?

## 2020-07-27 ENCOUNTER — Telehealth: Payer: Self-pay | Admitting: Family Medicine

## 2020-07-27 NOTE — Chronic Care Management (AMB) (Signed)
  Chronic Care Management   Note  07/27/2020 Name: Tavita Eastham MRN: [id number] DOB: Jan 26, 1951  Johny Sax Markowicz is a 70 y.o. year old male who is a primary care patient of Luetta Nutting, DO. I reached out to [employer] by phone today in response to a referral sent by Mr. Johny Sax Pecora's PCP, Luetta Nutting, DO.   Mr. Bivins was given information about Chronic Care Management services today including:  1. CCM service includes personalized support from designated clinical staff supervised by his physician, including individualized plan of care and coordination with other care providers 2. 24/7 contact phone numbers for assistance for urgent and routine care needs. 3. Service will only be billed when office clinical staff spend 20 minutes or more in a month to coordinate care. 4. Only one practitioner may furnish and bill the service in a calendar month. 5. The patient may stop CCM services at any time (effective at the end of the month) by phone call to the office staff.   Patient did not agree to enrollment in care management services and does not wish to consider at this time.  Follow up plan:   Lauretta Grill [employer] Scheduler

## 2020-10-24 ENCOUNTER — Other Ambulatory Visit: Payer: Self-pay | Admitting: Family Medicine

## 2020-12-03 ENCOUNTER — Telehealth: Payer: Self-pay

## 2020-12-03 NOTE — Telephone Encounter (Signed)
Spoke with patient and he wanted to wait to schedule the follow up appointment as he said he isn't sure of his schedule right now. AM

## 2020-12-03 NOTE — Telephone Encounter (Signed)
Pt lvm canceling 12/27/20 appt due to upcoming dental work starting this week.   Please call and schedule patient for follow-up in the Spring.   Thanks

## 2020-12-27 ENCOUNTER — Ambulatory Visit: Payer: Medicare Other | Admitting: Family Medicine

## 2021-01-18 ENCOUNTER — Encounter: Payer: Self-pay | Admitting: Family Medicine

## 2021-01-21 ENCOUNTER — Encounter: Payer: Self-pay | Admitting: Family Medicine

## 2021-01-21 MED ORDER — LOSARTAN POTASSIUM 50 MG PO TABS: 50.0000 mg | ORAL_TABLET | Freq: Every day | ORAL | 1 refills | Status: DC

## 2021-01-21 NOTE — Telephone Encounter (Signed)
More likely to come from lisinopril than from lipitor.  Would recommend change in this initially.    CM

## 2021-01-23 ENCOUNTER — Other Ambulatory Visit: Payer: Self-pay | Admitting: Family Medicine

## 2021-01-23 DIAGNOSIS — I119 Hypertensive heart disease without heart failure: Secondary | ICD-10-CM

## 2021-01-24 ENCOUNTER — Encounter: Payer: Self-pay | Admitting: Family Medicine

## 2021-01-24 NOTE — Telephone Encounter (Signed)
Patient said he would call back to get this appt scheduled. AM

## 2021-02-04 ENCOUNTER — Encounter: Payer: Self-pay | Admitting: Family Medicine

## 2021-02-05 ENCOUNTER — Encounter: Payer: Self-pay | Admitting: Family Medicine

## 2021-02-05 ENCOUNTER — Telehealth (INDEPENDENT_AMBULATORY_CARE_PROVIDER_SITE_OTHER): Payer: Medicare Other | Admitting: Family Medicine

## 2021-02-05 DIAGNOSIS — I10 Essential (primary) hypertension: Secondary | ICD-10-CM

## 2021-02-05 DIAGNOSIS — I119 Hypertensive heart disease without heart failure: Secondary | ICD-10-CM

## 2021-02-05 DIAGNOSIS — F17210 Nicotine dependence, cigarettes, uncomplicated: Secondary | ICD-10-CM | POA: Diagnosis not present

## 2021-02-05 MED ORDER — OMEPRAZOLE 40 MG PO CPDR: 40.0000 mg | DELAYED_RELEASE_CAPSULE | Freq: Every day | ORAL | 3 refills | Status: DC

## 2021-02-05 NOTE — Assessment & Plan Note (Signed)
Reports blood pressures at home have been well controlled.  Discussed differences and dosing between current medication and previous medication.  Low-sodium diet encouraged.

## 2021-02-05 NOTE — Progress Notes (Signed)
Jeremy Miller - 70 y.o. male MRN [id number]  Date of birth: 06-28-1950   This visit type was conducted due to national recommendations for restrictions regarding the COVID-19 Pandemic (e.g. social distancing).  This format is felt to be most appropriate for this patient at this time.  All issues noted in this document were discussed and addressed.  No physical exam was performed (except for noted visual exam findings with Video Visits).  I discussed the limitations of evaluation and management by telemedicine and the availability of in person appointments. The patient expressed understanding and agreed to proceed.  I connected withNAME@ on 02/05/21 at 10:10 AM EST by a video enabled telemedicine application and verified that I am speaking with the correct person using two identifiers.  Present at visit: Luetta Nutting, DO Kandace Parkins   Patient Location: [place] [address]   Provider location:   [facility]  Chief Complaint  Patient presents with   Hypertension    HPI  Jeremy Miller is a 70 y.o. male who presents via audio/video conferencing for a telehealth visit today.  He is following up on hypertension.  Recently switched from lisinopril to losartan due to cough.  Reports a dry cough has resolved with change in medication.  He has some questions regarding strength medication as lisinopril was 20 mg and current losartan is 50 mg.  He does continue to smoke.  No interest in quitting at this time.  He denies chest pain, shortness of breath, palpitations, headache or vision changes.   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/09/2015   HTN (hypertension) 03/27/2015   Prediabetes 03/29/2015   Stroke [facility])     Past Surgical History:  Procedure Laterality Date   APPENDECTOMY     HAND SURGERY     NOSE SURGERY     VASECTOMY       Family History  Problem Relation Age of Onset   Diabetes Mother    Hypertension Mother    Hyperlipidemia Mother    Narcolepsy Mother    Stroke Mother    ALS Sister    Stroke Sister     Social History   Socioeconomic History   Marital status: Divorced    Spouse name: Not on file   Number of children: Not on file   Years of education: Not on file   Highest education level: Not on file  Occupational History   Not on file  Tobacco Use   Smoking status: Every Day    Packs/day: 2.00    Years: 49.00    Pack years: 98.00    Types: Cigarettes   Smokeless tobacco: Never   Tobacco comments:    1ppd currently  Substance and Sexual Activity   Alcohol use: No    Alcohol/week: 0.0 standard drinks   Drug use: No   Sexual activity: Not on file  Other Topics Concern   Not on file  Social History Narrative   Not on file   Social Determinants of Health   Financial Resource Strain: Not on file  Food Insecurity: Not on file  Transportation Needs: Not on file  Physical Activity: Not on file  Stress: Not on file  Social Connections: Not on file  Intimate Partner Violence: Not on file     Current Outpatient Medications:    albuterol (VENTOLIN HFA) 108 (90  Base) MCG/ACT inhaler, Inhale 2 puffs into the lungs every 6 (six) hours as needed for wheezing or shortness of breath., Disp: 8 g, Rfl: 2   aspirin 325 MG tablet, Take 325 mg by mouth daily., Disp: , Rfl:    atorvastatin (LIPITOR) 40 MG tablet, TAKE 1 TABLET (40 MG TOTAL) BY MOUTH DAILY., Disp: 90 tablet, Rfl: 0   diclofenac sodium (VOLTAREN) 1 % GEL, Apply 2 g topically 4 (four) times daily. To affected joint. (Patient taking differently: Apply 2 g topically as needed. To affected joint.), Disp: 100 g, Rfl: 11   hydrochlorothiazide (HYDRODIURIL) 25 MG tablet, TAKE 1 TABLET BY MOUTH ONCE DAILY, Disp: 90 tablet, Rfl: 0   losartan (COZAAR) 50 MG tablet, Take 1 tablet (50 mg total) by mouth daily., Disp: 90 tablet, Rfl: 1    nitroGLYCERIN (NITROSTAT) 0.4 MG SL tablet, Place 1 tablet (0.4 mg total) under the tongue every 5 (five) minutes as needed for chest pain (x 3 pills). Reported on 04/23/2015, Disp: 25 tablet, Rfl: 1   omeprazole (PRILOSEC) 40 MG capsule, Take 1 capsule (40 mg total) by mouth daily., Disp: 90 capsule, Rfl: 3  EXAM:  VITALS per patient if applicable: Ht 5\' 6"  (1.676 m)   Wt 171 lb (77.6 kg)   BMI 27.60 kg/m   GENERAL: alert, oriented, appears well and in no acute distress  HEENT: atraumatic, conjunttiva clear, no obvious abnormalities on inspection of external nose and ears  NECK: normal movements of the head and neck  LUNGS: on inspection no signs of respiratory distress, breathing rate appears normal, no obvious gross SOB, gasping or wheezing  CV: no obvious cyanosis  MS: moves all visible extremities without noticeable abnormality  PSYCH/NEURO: pleasant and cooperative, no obvious depression or anxiety, speech and thought processing grossly intact  ASSESSMENT AND PLAN:  Discussed the following assessment and plan:  Nicotine dependence Counseled on smoking cessation.  Not interested in quitting at this time.  HTN (hypertension) Reports blood pressures at home have been well controlled.  Discussed differences and dosing between current medication and previous medication.  Low-sodium diet encouraged.     I discussed the assessment and treatment plan with the patient. The patient was provided an opportunity to ask questions and all were answered. The patient agreed with the plan and demonstrated an understanding of the instructions.   The patient was advised to call back or seek an in-person evaluation if the symptoms worsen or if the condition fails to improve as anticipated.    Luetta Nutting, DO

## 2021-02-05 NOTE — Assessment & Plan Note (Signed)
Counseled on smoking cessation.  Not interested in quitting at this time.

## 2021-04-23 ENCOUNTER — Other Ambulatory Visit: Payer: Self-pay | Admitting: Family Medicine

## 2021-04-25 MED ORDER — ASPIRIN 325 MG PO TABS: 325.0000 mg | ORAL_TABLET | Freq: Every day | ORAL | 3 refills | Status: AC

## 2021-05-09 ENCOUNTER — Other Ambulatory Visit: Payer: Self-pay | Admitting: Family Medicine

## 2021-05-09 MED ORDER — HYDROCHLOROTHIAZIDE 25 MG PO TABS: 25.0000 mg | ORAL_TABLET | Freq: Every day | ORAL | 3 refills | Status: DC

## 2021-06-25 ENCOUNTER — Encounter: Payer: Self-pay | Admitting: Family Medicine

## 2021-06-25 ENCOUNTER — Ambulatory Visit (INDEPENDENT_AMBULATORY_CARE_PROVIDER_SITE_OTHER): Payer: Medicare Other | Admitting: Family Medicine

## 2021-06-25 ENCOUNTER — Ambulatory Visit (INDEPENDENT_AMBULATORY_CARE_PROVIDER_SITE_OTHER): Payer: Medicare Other

## 2021-06-25 VITALS — BP 136/72 | HR 61 | Ht 66.0 in | Wt 166.0 lb

## 2021-06-25 DIAGNOSIS — I10 Essential (primary) hypertension: Secondary | ICD-10-CM | POA: Diagnosis not present

## 2021-06-25 DIAGNOSIS — F17210 Nicotine dependence, cigarettes, uncomplicated: Secondary | ICD-10-CM | POA: Diagnosis not present

## 2021-06-25 DIAGNOSIS — Z122 Encounter for screening for malignant neoplasm of respiratory organs: Secondary | ICD-10-CM | POA: Diagnosis not present

## 2021-06-25 DIAGNOSIS — H6123 Impacted cerumen, bilateral: Secondary | ICD-10-CM

## 2021-06-25 DIAGNOSIS — E782 Mixed hyperlipidemia: Secondary | ICD-10-CM | POA: Diagnosis not present

## 2021-06-25 DIAGNOSIS — R7303 Prediabetes: Secondary | ICD-10-CM

## 2021-06-25 NOTE — Assessment & Plan Note (Signed)
Continue atorvastatin at current strength.  Updated labs ordered.   ?

## 2021-06-25 NOTE — Assessment & Plan Note (Signed)
Counseled on smoking cessation.  Updated lung cancer screening ordered.   ?

## 2021-06-25 NOTE — Assessment & Plan Note (Signed)
BP readings at home and repeat reading in the office is well controlled.  Continue current medications for management of HTN.   ?

## 2021-06-25 NOTE — Patient Instructions (Signed)
Great to see you! Continue current medications.  See me again in 6 months.  

## 2021-06-25 NOTE — Assessment & Plan Note (Signed)
Updated a1c ordered.   ?

## 2021-06-25 NOTE — Assessment & Plan Note (Signed)
Impacted cerumen.  Lavage completed today.  Tolerated well with improvement.   ?

## 2021-06-25 NOTE — Progress Notes (Signed)
?Doc Mandala - 71 y.o. male MRN [id number]  Date of birth: 09-16-1950 ? ?Subjective ?No chief complaint on file. ? ? ?HPI ?Jeremy Miller is a 71 y.o. male here today for follow up visit.  Reports having bronchitis a few weeks ago.  Treated with steroids and z-pack.  He has recovered well from this.  He does continue to smoke and has no intention of quitting.  He is due for follow up lung cancer screening.   ? ?Continues to do well with losartan and hctz for management of blood pressure.  Denies anginal symptoms, dyspnea, headache or vision changes.  ? ?Tolerating atorvastatin well for treatment of HLD.  Due for updated labs.  ? ?Having some increased hearing difficulty.  History of cerumen impaction.   ? ?ROS:  A comprehensive ROS was completed and negative except as noted per HPI ? ?No Known Allergies ? ?Past Medical History:  ?Diagnosis Date  ? 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 HTN (hypertension) 03/27/2015  ? Prediabetes 03/29/2015  ? Stroke [facility])   ? ? ?Past Surgical History:  ?Procedure Laterality Date  ? APPENDECTOMY    ? HAND SURGERY    ? NOSE SURGERY    ? VASECTOMY    ? ? ?Social History  ? ?Socioeconomic History  ? Marital status: Divorced  ?  Spouse name: Not on file  ? Number of children: Not on file  ? Years of education: Not on file  ? Highest education level: Not on file  ?Occupational History  ? Not on file  ?Tobacco Use  ? Smoking status: Every Day  ?  Packs/day: 2.00  ?  Years: 49.00  ?  Pack years: 98.00  ?  Types: Cigarettes  ? Smokeless tobacco: Never  ? Tobacco comments:  ?  1ppd currently  ?Substance and Sexual Activity  ? Alcohol use: No  ?  Alcohol/week: 0.0 standard drinks  ? Drug use: No  ? Sexual activity: Not on file  ?Other Topics Concern  ? Not on file  ?Social History Narrative  ? Not on file  ? ?Social Determinants of Health  ? ?Financial Resource Strain: Not on file  ?Food Insecurity: Not on file  ?Transportation Needs: Not on file  ?Physical Activity:  Not on file  ?Stress: Not on file  ?Social Connections: Not on file  ? ? ?Family History  ?Problem Relation Age of Onset  ? Diabetes Mother   ? Hypertension Mother   ? Hyperlipidemia Mother   ? Narcolepsy Mother   ? Stroke Mother   ? ALS Sister   ? Stroke Sister   ? ? ?Health Maintenance  ?Topic Date Due  ? COVID-19 Vaccine (5 - Booster for [place] series) 02/01/2020  ? COLONOSCOPY (Pts 45-84yr Insurance coverage will need to be confirmed)  08/01/2020  ? INFLUENZA VACCINE  10/08/2021  ? TETANUS/TDAP  03/26/2025  ? Pneumonia Vaccine 71 Years old  Completed  ? Hepatitis C Screening  Completed  ? Zoster Vaccines- Shingrix  Completed  ? HPV VACCINES  Aged Out  ? ? ? ?----------------------------------------------------------------------------------------------------------------------------------------------------------------------------------------------------------------- ?Physical Exam ?BP 136/72   Pulse 61   Ht '5\' 6"'$  (1.676 m)   Wt 166 lb (75.3 kg)   SpO2 97%   BMI 26.79 kg/m?  ? ?Physical Exam ?Constitutional:   ?   Appearance: Normal appearance.  ?Eyes:  ?   General: No scleral icterus. ?Cardiovascular:  ?   Rate and Rhythm: Normal rate and regular rhythm.  ?Pulmonary:  ?  Effort: Pulmonary effort is normal.  ?   Breath sounds: Normal breath sounds.  ?Musculoskeletal:  ?   Cervical back: Neck supple.  ?Neurological:  ?   Mental Status: He is alert.  ?Psychiatric:     ?   Mood and Affect: Mood normal.     ?   Behavior: Behavior normal.  ? ? ?------------------------------------------------------------------------------------------------------------------------------------------------------------------------------------------------------------------- ?Assessment and Plan ? ?HTN (hypertension) ?BP readings at home and repeat reading in the office is well controlled.  Continue current medications for management of HTN.   ? ?Cerumen impaction ?Impacted cerumen.  Lavage completed today.  Tolerated well with  improvement.   ? ?HLD (hyperlipidemia) ?Continue atorvastatin at current strength.  Updated labs ordered.   ? ?Nicotine dependence ?Counseled on smoking cessation.  Updated lung cancer screening ordered.   ? ?Prediabetes ?Updated a1c ordered.   ? ? ?No orders of the defined types were placed in this encounter. ? ? ?No follow-ups on file. ? ? ? ?This visit occurred during the SARS-CoV-2 public health emergency.  Safety protocols were in place, including screening questions prior to the visit, additional usage of staff PPE, and extensive cleaning of exam room while observing appropriate contact time as indicated for disinfecting solutions.  ? ?

## 2021-06-26 LAB — LIPID PANEL W/REFLEX DIRECT LDL
Cholesterol: 115 mg/dL (ref <200)
HDL: 43 mg/dL (ref >=40)
LDL Cholesterol (Calc): 56 mg/dL (calc)
Non-HDL Cholesterol (Calc): 72 mg/dL (calc) (ref <130)
Total CHOL/HDL Ratio: 2.7 (calc) (ref <5.0)
Triglycerides: 82 mg/dL (ref <150)

## 2021-06-26 LAB — CBC WITH DIFFERENTIAL/PLATELET
Absolute Monocytes: 690 cells/uL (ref 200–950)
Basophils Absolute: 110 cells/uL (ref 0–200)
Basophils Relative: 1.1 %
Eosinophils Absolute: 60 cells/uL (ref 15–500)
Eosinophils Relative: 0.6 %
HCT: 41.3 % (ref 38.5–50.0)
Hemoglobin: 13.9 g/dL (ref 13.2–17.1)
Lymphs Abs: 2380 cells/uL (ref 850–3900)
MCH: 30 pg (ref 27.0–33.0)
MCHC: 33.7 g/dL (ref 32.0–36.0)
MCV: 89 fL (ref 80.0–100.0)
MPV: 9 fL (ref 7.5–12.5)
Monocytes Relative: 6.9 %
Neutro Abs: 6760 cells/uL (ref 1500–7800)
Neutrophils Relative %: 67.6 %
Platelets: 619 10*3/uL — ABNORMAL HIGH (ref 140–400)
RBC: 4.64 10*6/uL (ref 4.20–5.80)
RDW: 12.1 % (ref 11.0–15.0)
Total Lymphocyte: 23.8 %
WBC: 10 10*3/uL (ref 3.8–10.8)

## 2021-06-26 LAB — COMPLETE METABOLIC PANEL WITH GFR
AG Ratio: 1.5 (calc) (ref 1.0–2.5)
ALT: 10 U/L (ref 9–46)
AST: 16 U/L (ref 10–35)
Albumin: 4.1 g/dL (ref 3.6–5.1)
Alkaline phosphatase (APISO): 92 U/L (ref 35–144)
BUN: 12 mg/dL (ref 7–25)
CO2: 31 mmol/L (ref 20–32)
Calcium: 9.7 mg/dL (ref 8.6–10.3)
Chloride: 97 mmol/L — ABNORMAL LOW (ref 98–110)
Creat: 0.93 mg/dL (ref 0.70–1.28)
Globulin: 2.8 g/dL (calc) (ref 1.9–3.7)
Glucose, Bld: 95 mg/dL (ref 65–99)
Potassium: 5 mmol/L (ref 3.5–5.3)
Sodium: 138 mmol/L (ref 135–146)
Total Bilirubin: 0.6 mg/dL (ref 0.2–1.2)
Total Protein: 6.9 g/dL (ref 6.1–8.1)
eGFR: 88 mL/min/{1.73_m2} (ref >=60)

## 2021-06-26 LAB — HEMOGLOBIN A1C
Hgb A1c MFr Bld: 6 % of total Hgb — ABNORMAL HIGH (ref <5.7)
Mean Plasma Glucose: 126 mg/dL
eAG (mmol/L): 7 mmol/L

## 2021-07-18 ENCOUNTER — Encounter: Payer: Self-pay | Admitting: Family Medicine

## 2021-07-18 NOTE — Progress Notes (Signed)
Unable to cancel resulted Abstract labs. Results charted on incorrect patient during system update.  ?

## 2021-07-19 ENCOUNTER — Other Ambulatory Visit: Payer: Self-pay

## 2021-07-19 DIAGNOSIS — I119 Hypertensive heart disease without heart failure: Secondary | ICD-10-CM

## 2021-07-19 MED ORDER — LOSARTAN POTASSIUM 50 MG PO TABS: 50.0000 mg | ORAL_TABLET | Freq: Every day | ORAL | 2 refills | Status: DC

## 2021-07-19 MED ORDER — ATORVASTATIN CALCIUM 40 MG PO TABS: 40.0000 mg | ORAL_TABLET | Freq: Every day | ORAL | 2 refills | Status: DC

## 2021-07-23 ENCOUNTER — Encounter: Payer: Self-pay | Admitting: Family Medicine

## 2021-07-29 NOTE — Progress Notes (Signed)
Order(s) created erroneously. Erroneous order ID: [id number]  Order canceled by: CHART CORRECTION ANALYST NINE, IDENTITY  Order cancel date/time: 07/29/2021 6:34 AM  Erroneous order ID: [id number]  Order canceled by: CHART CORRECTION ANALYST NINE, IDENTITY  Order cancel date/time: 07/29/2021 6:34 AM  Erroneous order ID: [id number]  Order canceled by: CHART CORRECTION ANALYST NINE, IDENTITY  Order cancel date/time: 07/29/2021 6:34 AM  Erroneous order ID: [id number]  Order canceled by: CHART CORRECTION ANALYST NINE, IDENTITY  Order cancel date/time: 07/29/2021 6:34 AM

## 2021-09-13 ENCOUNTER — Encounter: Payer: Self-pay | Admitting: Family Medicine

## 2021-09-13 DIAGNOSIS — R911 Solitary pulmonary nodule: Secondary | ICD-10-CM

## 2021-09-13 NOTE — Telephone Encounter (Signed)
Orders entered

## 2021-09-16 ENCOUNTER — Ambulatory Visit (INDEPENDENT_AMBULATORY_CARE_PROVIDER_SITE_OTHER): Payer: Medicare Other

## 2021-09-16 ENCOUNTER — Other Ambulatory Visit: Payer: Self-pay | Admitting: Family Medicine

## 2021-09-16 DIAGNOSIS — Z87891 Personal history of nicotine dependence: Secondary | ICD-10-CM

## 2021-09-16 DIAGNOSIS — Z122 Encounter for screening for malignant neoplasm of respiratory organs: Secondary | ICD-10-CM

## 2021-09-17 ENCOUNTER — Encounter: Payer: Self-pay | Admitting: Family Medicine

## 2021-10-30 ENCOUNTER — Encounter: Payer: Self-pay | Admitting: General Practice

## 2021-11-24 ENCOUNTER — Encounter: Payer: Self-pay | Admitting: Family Medicine

## 2021-11-25 MED ORDER — NITROGLYCERIN 0.4 MG SL SUBL: 0.4000 mg | SUBLINGUAL_TABLET | SUBLINGUAL | 1 refills | Status: DC | PRN

## 2021-11-25 MED ORDER — ALBUTEROL SULFATE HFA 108 (90 BASE) MCG/ACT IN AERS: 2.0000 | INHALATION_SPRAY | Freq: Four times a day (QID) | RESPIRATORY_TRACT | 2 refills | Status: DC | PRN

## 2021-12-25 ENCOUNTER — Ambulatory Visit (INDEPENDENT_AMBULATORY_CARE_PROVIDER_SITE_OTHER): Payer: Medicare Other | Admitting: Family Medicine

## 2021-12-25 ENCOUNTER — Encounter: Payer: Self-pay | Admitting: Family Medicine

## 2021-12-25 DIAGNOSIS — I10 Essential (primary) hypertension: Secondary | ICD-10-CM | POA: Diagnosis not present

## 2021-12-25 DIAGNOSIS — F17210 Nicotine dependence, cigarettes, uncomplicated: Secondary | ICD-10-CM

## 2021-12-25 DIAGNOSIS — E782 Mixed hyperlipidemia: Secondary | ICD-10-CM | POA: Diagnosis not present

## 2021-12-25 MED ORDER — LOSARTAN POTASSIUM 50 MG PO TABS
50.0000 mg | ORAL_TABLET | Freq: Every day | ORAL | 2 refills | Status: DC
Start: 2021-12-25 — End: 2022-10-09

## 2021-12-25 NOTE — Patient Instructions (Addendum)
Great to see you! Work on cutting back on smoking 6 month follow up/fasting labs at that visit.  Updated lung cancer screening will be ordered at next visit.

## 2021-12-25 NOTE — Assessment & Plan Note (Signed)
Counseled on smoking cessation.  Not ready to quit at this time.  Continue annual lung cancer screening.

## 2021-12-25 NOTE — Assessment & Plan Note (Signed)
Blood pressures well controlled with current medications.  Recommend continuation at this time.

## 2021-12-25 NOTE — Assessment & Plan Note (Signed)
Tolerating atorvastatin well.  Continue at current strength.  

## 2021-12-25 NOTE — Progress Notes (Signed)
Jeremy Miller - 71 y.o. male MRN [id number]  Date of birth: 04-Mar-1951  Subjective Chief Complaint  Patient presents with   Hypertension    6 mth f/u     HPI Jeremy Miller is a 71 year old male here today for follow-up visit.  Reports overall he is feeling well.  Continues on hydrochlorothiazide and losartan for management of hypertension.  He denies side effects from medication.  Blood pressure has been well controlled with this.  He denies chest pain, shortness of breath, palpitations, headaches or vision changes.  Continues to smoke, approximately 2 packs/day.  Lung cancer screening is up-to-date.  No significant respiratory symptoms.  He does have albuterol that he uses occasionally.  Continues on atorvastatin 40 mg daily.  Tolerating this well.  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   HTN (hypertension) 03/27/2015   Prediabetes 03/29/2015   Stroke [facility])     Past Surgical History:  Procedure Laterality Date   APPENDECTOMY     HAND SURGERY     NOSE SURGERY     VASECTOMY      Social History   Socioeconomic History   Marital status: Divorced    Spouse name: Not on file   Number of children: Not on file   Years of education: Not on file   Highest education level: Not on file  Occupational History   Not on file  Tobacco Use   Smoking status: Every Day    Packs/day: 2.00    Years: 49.00    Total pack years: 98.00    Types: Cigarettes   Smokeless tobacco: Never   Tobacco comments:    1ppd currently  Substance and Sexual Activity   Alcohol use: No    Alcohol/week: 0.0 standard drinks of alcohol   Drug use: No   Sexual activity: Not on file  Other Topics Concern   Not on file  Social History Narrative   Not on file   Social Determinants of Health   Financial Resource Strain: Not on file  Food  Insecurity: Not on file  Transportation Needs: Not on file  Physical Activity: Not on file  Stress: Not on file  Social Connections: Not on file    Family History  Problem Relation Age of Onset   Diabetes Mother    Hypertension Mother    Hyperlipidemia Mother    Narcolepsy Mother    Stroke Mother    ALS Sister    Stroke Sister     Health Maintenance  Topic Date Due   COVID-19 Vaccine (6 - Pfizer series) 01/10/2022 (Originally 05/14/2021)   INFLUENZA VACCINE  06/08/2022 (Originally 10/08/2021)   COLONOSCOPY (Pts 45-53yr Insurance coverage will need to be confirmed)  06/26/2022 (Originally 08/01/2020)   TETANUS/TDAP  03/26/2025   Pneumonia Vaccine 71 Years old  Completed   Hepatitis C Screening  Completed   Zoster Vaccines- Shingrix  Completed   HPV VACCINES  Aged Out     ----------------------------------------------------------------------------------------------------------------------------------------------------------------------------------------------------------------- Physical Exam BP 103/66   Pulse 76   Ht '5\' 6"'$  (1.676 m)   Wt 171 lb (77.6 kg)   SpO2 98%   BMI 27.60 kg/m   Physical Exam Constitutional:      Appearance: Normal appearance.  Eyes:     General: No scleral icterus. Cardiovascular:     Rate and Rhythm: Normal rate  and regular rhythm.  Pulmonary:     Effort: Pulmonary effort is normal.     Breath sounds: Normal breath sounds.  Musculoskeletal:     Cervical back: Neck supple.  Neurological:     Mental Status: He is alert.  Psychiatric:        Mood and Affect: Mood normal.        Behavior: Behavior normal.     ------------------------------------------------------------------------------------------------------------------------------------------------------------------------------------------------------------------- Assessment and Plan  HTN (hypertension) Blood pressures well controlled with current medications.  Recommend continuation  at this time.  Nicotine dependence Counseled on smoking cessation.  Not ready to quit at this time.  Continue annual lung cancer screening.  HLD (hyperlipidemia) Tolerating atorvastatin well.  Continue at current strength.   Meds ordered this encounter  Medications   losartan (COZAAR) 50 MG tablet    Sig: Take 1 tablet (50 mg total) by mouth daily.    Dispense:  90 tablet    Refill:  2    Return in about 6 months (around 06/26/2022) for HTN/Fasting labs/ Lung cancer screening.    This visit occurred during the SARS-CoV-2 public health emergency.  Safety protocols were in place, including screening questions prior to the visit, additional usage of staff PPE, and extensive cleaning of exam room while observing appropriate contact time as indicated for disinfecting solutions.

## 2022-04-29 ENCOUNTER — Telehealth: Payer: Self-pay | Admitting: Family Medicine

## 2022-04-29 ENCOUNTER — Other Ambulatory Visit: Payer: Self-pay | Admitting: Family Medicine

## 2022-04-29 DIAGNOSIS — I119 Hypertensive heart disease without heart failure: Secondary | ICD-10-CM

## 2022-04-29 MED ORDER — OMEPRAZOLE 40 MG PO CPDR: 40.0000 mg | DELAYED_RELEASE_CAPSULE | Freq: Every day | ORAL | 3 refills | Status: DC

## 2022-04-29 MED ORDER — HYDROCHLOROTHIAZIDE 25 MG PO TABS: 25.0000 mg | ORAL_TABLET | Freq: Every day | ORAL | 3 refills | Status: DC

## 2022-04-29 NOTE — Telephone Encounter (Signed)
Contacted Jeremy Miller to schedule their annual wellness visit. Patient declined to schedule AWV at this time.  [name] Patient Arts administrator II Direct Dial: [phone]

## 2022-06-26 ENCOUNTER — Encounter: Payer: Self-pay | Admitting: Family Medicine

## 2022-06-26 ENCOUNTER — Ambulatory Visit (INDEPENDENT_AMBULATORY_CARE_PROVIDER_SITE_OTHER): Payer: Medicare Other | Admitting: Family Medicine

## 2022-06-26 VITALS — BP 119/70 | HR 67 | Ht 66.0 in | Wt 179.0 lb

## 2022-06-26 DIAGNOSIS — R7303 Prediabetes: Secondary | ICD-10-CM | POA: Diagnosis not present

## 2022-06-26 DIAGNOSIS — E782 Mixed hyperlipidemia: Secondary | ICD-10-CM | POA: Diagnosis not present

## 2022-06-26 DIAGNOSIS — Z8673 Personal history of transient ischemic attack (TIA), and cerebral infarction without residual deficits: Secondary | ICD-10-CM

## 2022-06-26 DIAGNOSIS — F17210 Nicotine dependence, cigarettes, uncomplicated: Secondary | ICD-10-CM

## 2022-06-26 DIAGNOSIS — J449 Chronic obstructive pulmonary disease, unspecified: Secondary | ICD-10-CM

## 2022-06-26 DIAGNOSIS — I10 Essential (primary) hypertension: Secondary | ICD-10-CM

## 2022-06-26 DIAGNOSIS — R351 Nocturia: Secondary | ICD-10-CM | POA: Diagnosis not present

## 2022-06-26 LAB — PSA: PSA: 1.32 ng/mL (ref <=4.00)

## 2022-06-26 MED ORDER — TRELEGY ELLIPTA 100-62.5-25 MCG/ACT IN AEPB: 1.0000 | INHALATION_SPRAY | Freq: Every day | RESPIRATORY_TRACT | 0 refills | Status: DC

## 2022-06-26 MED ORDER — TRELEGY ELLIPTA 100-62.5-25 MCG/ACT IN AEPB: 1.0000 | INHALATION_SPRAY | Freq: Every day | RESPIRATORY_TRACT | 3 refills | Status: DC

## 2022-06-26 NOTE — Assessment & Plan Note (Signed)
Continue asa and statin.  He may reduce this to  daily.

## 2022-06-26 NOTE — Patient Instructions (Addendum)
Let's add trelegy daily.  You may continue rescue inhaler as needed.  Shoot me a message and let me know how this is working.   It would be ok for you to reduce your aspirin to  daily.

## 2022-06-26 NOTE — Progress Notes (Signed)
Jeremy Miller - 72 y.o. male MRN [id number]  Date of birth: April 01, 1950  Subjective Chief Complaint  Patient presents with   Hypertension   Hyperlipidemia    HPI Jeremy Miller is a 72 y.o. male here today for follow up visit.   He reports that he is doing ok.  He is needing his rescue inhaler more frequently and waking up more at night feeling short of breath.  He is also urinating more frequently at night.  More mucus production in the morning.  He does continue to smoke.   BP remains well controlled with losartan and hctz at current strength.  No side effects at current strength.  He has gained some weight since his last visit.  Admits to snacking frequently on chips, pretzels and sweets.  He does continue to stay pretty active. Denies anginal symptoms, headache or vision changes.   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/09/2015   HTN (hypertension) 03/27/2015   Prediabetes 03/29/2015   Stroke     Past Surgical History:  Procedure Laterality Date   APPENDECTOMY     HAND SURGERY     NOSE SURGERY     VASECTOMY      Social History   Socioeconomic History   Marital status: Divorced    Spouse name: Not on file   Number of children: Not on file   Years of education: Not on file   Highest education level: 12th grade  Occupational History   Not on file  Tobacco Use   Smoking status: Every Day    Packs/day: 2.00    Years: 49.00    Additional pack years: 0.00    Total pack years: 98.00    Types: Cigarettes   Smokeless tobacco: Never   Tobacco comments:    1ppd currently  Substance and Sexual Activity   Alcohol use: No    Alcohol/week: 0.0 standard drinks of alcohol   Drug use: No   Sexual activity: Not on file  Other Topics Concern   Not on file  Social History Narrative   Not on file   Social  Determinants of Health   Financial Resource Strain: Low Risk  (06/22/2022)   Overall Financial Resource Strain (CARDIA)    Difficulty of Paying Living Expenses: Not hard at all  Food Insecurity: No Food Insecurity (06/22/2022)   Hunger Vital Sign    Worried About Running Out of Food in the Last Year: Never true    Ran Out of Food in the Last Year: Never true  Transportation Needs: No Transportation Needs (06/22/2022)   PRAPARE - Administrator, Civil Service (Medical): No    Lack of Transportation (Non-Medical): No  Physical Activity: Insufficiently Active (06/22/2022)   Exercise Vital Sign    Days of Exercise per Week: 3 days    Minutes of Exercise per Session: 40 min  Stress: No Stress Concern Present (06/22/2022)   [employer] of Occupational Health - Occupational Stress Questionnaire    Feeling of Stress : Not at all  Social Connections: Unknown (06/22/2022)   Social Connection and Isolation Panel [NHANES]    Frequency of Communication with Friends and Family: More than three times a week    Frequency of Social Gatherings with Friends and Family: More than three times a week    Attends Religious  Services: Never    Active Member of Clubs or Organizations: Not on file    Attends Banker Meetings: Not on file    Marital Status: Divorced    Family History  Problem Relation Age of Onset   Diabetes Mother    Hypertension Mother    Hyperlipidemia Mother    Narcolepsy Mother    Stroke Mother    ALS Sister    Stroke Sister     Health Maintenance  Topic Date Due   COLONOSCOPY (Pts 45-8yrs Insurance coverage will need to be confirmed)  06/26/2022 (Originally 08/01/2020)   COVID-19 Vaccine (6 - 2023-24 season) 12/12/2022 (Originally 11/08/2021)   Medicare Annual Wellness (AWV)  12/26/2022 (Originally 07/03/2018)   Lung Cancer Screening  09/17/2022   INFLUENZA VACCINE  10/09/2022   DTaP/Tdap/Td (2 - Td or Tdap) 03/26/2025   Pneumonia Vaccine 62+ Years old   Completed   Hepatitis C Screening  Completed   Zoster Vaccines- Shingrix  Completed   HPV VACCINES  Aged Out     ----------------------------------------------------------------------------------------------------------------------------------------------------------------------------------------------------------------- Physical Exam BP 119/70 (BP Location: Left Arm, Patient Position: Sitting, Cuff Size: Large)   Pulse 67   Ht  (1.676 m)   Wt 179 lb (81.2 kg)   SpO2 95%   BMI 28.89 kg/m   Physical Exam Constitutional:      Appearance: Normal appearance.  HENT:     Head: Normocephalic and atraumatic.  Eyes:     General: No scleral icterus. Cardiovascular:     Rate and Rhythm: Normal rate and regular rhythm.  Pulmonary:     Effort: Pulmonary effort is normal.     Breath sounds: Normal breath sounds.  Musculoskeletal:     Cervical back: Neck supple.  Neurological:     Mental Status: He is alert.  Psychiatric:        Mood and Affect: Mood normal.        Behavior: Behavior normal.     ------------------------------------------------------------------------------------------------------------------------------------------------------------------------------------------------------------------- Assessment and Plan  HTN (hypertension) BP is well controlled.  Continue current medications.  We discussed cutting back on snacking to prevent worsening of blood pressure.    Prediabetes Update A1c.  Encouraged dietary changes.   Nicotine dependence Counseled on smoking cessation.  Updated low dose CT ordered.   History of CVA (cerebrovascular accident) Continue asa and statin.  He may reduce this to  daily.   HLD (hyperlipidemia) Update lipid panel. Tolerating atorvastatin well at current strength.    COPD (chronic obstructive pulmonary disease) Using rescue inhaler more frequently.  Adding trelegy daily.  He will let me know if this is helping.    Meds ordered  this encounter  Medications   Fluticasone-Umeclidin-Vilant (TRELEGY ELLIPTA) 100-62.5-25 MCG/ACT AEPB    Sig: Inhale 1 puff into the lungs daily. Exp: 07/2023 Lot: [id number]    Dispense:  1 each    Refill:  0   Fluticasone-Umeclidin-Vilant (TRELEGY ELLIPTA) 100-62.5-25 MCG/ACT AEPB    Sig: Inhale 1 puff into the lungs daily.    Dispense:  3 each    Refill:  3    Return in about 6 months (around 12/26/2022) for F/u HTN/.    This visit occurred during the SARS-CoV-2 public health emergency.  Safety protocols were in place, including screening questions prior to the visit, additional usage of staff PPE, and extensive cleaning of exam room while observing appropriate contact time as indicated for disinfecting solutions.

## 2022-06-26 NOTE — Assessment & Plan Note (Signed)
BP is well controlled.  Continue current medications.  We discussed cutting back on snacking to prevent worsening of blood pressure.

## 2022-06-26 NOTE — Assessment & Plan Note (Signed)
Counseled on smoking cessation.  Updated low dose CT ordered.

## 2022-06-26 NOTE — Assessment & Plan Note (Signed)
Update lipid panel. Tolerating atorvastatin well at current strength.

## 2022-06-26 NOTE — Assessment & Plan Note (Signed)
Using rescue inhaler more frequently.  Adding trelegy daily.  He will let me know if this is helping.

## 2022-06-26 NOTE — Assessment & Plan Note (Signed)
Update A1c.  Encouraged dietary changes.

## 2022-06-27 ENCOUNTER — Ambulatory Visit (INDEPENDENT_AMBULATORY_CARE_PROVIDER_SITE_OTHER): Payer: Medicare Other

## 2022-06-27 DIAGNOSIS — F17210 Nicotine dependence, cigarettes, uncomplicated: Secondary | ICD-10-CM | POA: Diagnosis not present

## 2022-06-27 LAB — CBC WITH DIFFERENTIAL/PLATELET
Absolute Monocytes: 689 cells/uL (ref 200–950)
Basophils Absolute: 66 cells/uL (ref 0–200)
Basophils Relative: 0.8 %
Eosinophils Absolute: 83 cells/uL (ref 15–500)
Eosinophils Relative: 1 %
HCT: 44.8 % (ref 38.5–50.0)
Hemoglobin: 14.9 g/dL (ref 13.2–17.1)
Lymphs Abs: 2058 cells/uL (ref 850–3900)
MCH: 29.7 pg (ref 27.0–33.0)
MCHC: 33.3 g/dL (ref 32.0–36.0)
MCV: 89.2 fL (ref 80.0–100.0)
MPV: 9.6 fL (ref 7.5–12.5)
Monocytes Relative: 8.3 %
Neutro Abs: 5403 cells/uL (ref 1500–7800)
Neutrophils Relative %: 65.1 %
Platelets: 365 10*3/uL (ref 140–400)
RBC: 5.02 10*6/uL (ref 4.20–5.80)
RDW: 12.1 % (ref 11.0–15.0)
Total Lymphocyte: 24.8 %
WBC: 8.3 10*3/uL (ref 3.8–10.8)

## 2022-06-27 LAB — COMPLETE METABOLIC PANEL WITH GFR
AG Ratio: 1.7 (calc) (ref 1.0–2.5)
ALT: 9 U/L (ref 9–46)
AST: 14 U/L (ref 10–35)
Albumin: 4.4 g/dL (ref 3.6–5.1)
Alkaline phosphatase (APISO): 91 U/L (ref 35–144)
BUN: 14 mg/dL (ref 7–25)
CO2: 30 mmol/L (ref 20–32)
Calcium: 9.9 mg/dL (ref 8.6–10.3)
Chloride: 100 mmol/L (ref 98–110)
Creat: 1.15 mg/dL (ref 0.70–1.28)
Globulin: 2.6 g/dL (calc) (ref 1.9–3.7)
Glucose, Bld: 102 mg/dL — ABNORMAL HIGH (ref 65–99)
Potassium: 5.4 mmol/L — ABNORMAL HIGH (ref 3.5–5.3)
Sodium: 138 mmol/L (ref 135–146)
Total Bilirubin: 0.6 mg/dL (ref 0.2–1.2)
Total Protein: 7 g/dL (ref 6.1–8.1)
eGFR: 68 mL/min/{1.73_m2} (ref >=60)

## 2022-06-27 LAB — HEMOGLOBIN A1C
Hgb A1c MFr Bld: 6.2 % of total Hgb — ABNORMAL HIGH (ref <5.7)
Mean Plasma Glucose: 131 mg/dL
eAG (mmol/L): 7.3 mmol/L

## 2022-06-27 LAB — LIPID PANEL W/REFLEX DIRECT LDL
Cholesterol: 131 mg/dL (ref <200)
HDL: 52 mg/dL (ref >=40)
LDL Cholesterol (Calc): 58 mg/dL (calc)
Non-HDL Cholesterol (Calc): 79 mg/dL (calc) (ref <130)
Total CHOL/HDL Ratio: 2.5 (calc) (ref <5.0)
Triglycerides: 119 mg/dL (ref <150)

## 2022-10-09 ENCOUNTER — Other Ambulatory Visit: Payer: Self-pay | Admitting: Family Medicine

## 2022-10-18 ENCOUNTER — Encounter: Payer: Self-pay | Admitting: Family Medicine

## 2022-11-27 ENCOUNTER — Encounter: Payer: Self-pay | Admitting: Family Medicine

## 2022-11-27 ENCOUNTER — Ambulatory Visit (INDEPENDENT_AMBULATORY_CARE_PROVIDER_SITE_OTHER): Payer: Medicare Other

## 2022-11-27 ENCOUNTER — Ambulatory Visit: Payer: Medicare Other | Admitting: Family Medicine

## 2022-11-27 VITALS — BP 146/77 | HR 66 | Ht 66.0 in | Wt 181.0 lb

## 2022-11-27 DIAGNOSIS — R0600 Dyspnea, unspecified: Secondary | ICD-10-CM | POA: Diagnosis not present

## 2022-11-27 DIAGNOSIS — I10 Essential (primary) hypertension: Secondary | ICD-10-CM

## 2022-11-27 DIAGNOSIS — M25562 Pain in left knee: Secondary | ICD-10-CM | POA: Diagnosis not present

## 2022-11-27 DIAGNOSIS — Z8673 Personal history of transient ischemic attack (TIA), and cerebral infarction without residual deficits: Secondary | ICD-10-CM

## 2022-11-27 DIAGNOSIS — R7303 Prediabetes: Secondary | ICD-10-CM

## 2022-11-27 DIAGNOSIS — J449 Chronic obstructive pulmonary disease, unspecified: Secondary | ICD-10-CM

## 2022-11-27 DIAGNOSIS — G8929 Other chronic pain: Secondary | ICD-10-CM | POA: Diagnosis not present

## 2022-11-27 LAB — POCT GLYCOSYLATED HEMOGLOBIN (HGB A1C): HbA1c, POC (controlled diabetic range): 6 % (ref 0.0–7.0)

## 2022-11-27 MED ORDER — PREDNISONE 20 MG PO TABS: 20.0000 mg | ORAL_TABLET | Freq: Two times a day (BID) | ORAL | 0 refills | Status: AC

## 2022-11-27 NOTE — Assessment & Plan Note (Addendum)
He is having some increased exertional dyspnea.  EKG without any new changes, likely COPD with current exacerbation.  Continue current inhalers.  Adding prednisone burst.  Chest x-ray ordered.  Smoking cessation encouraged.

## 2022-11-27 NOTE — Assessment & Plan Note (Addendum)
Blood pressure is a little elevated today.  He will monitor at home.  Low-sodium diet encouraged.

## 2022-11-27 NOTE — Assessment & Plan Note (Signed)
X-rays of the left knee ordered.

## 2022-11-27 NOTE — Progress Notes (Signed)
Jeremy Miller - 72 y.o. male MRN [id number]  Date of birth: Aug 01, 1950  Subjective Chief Complaint  Patient presents with   Shortness of Breath    HPI Jeremy Miller is a 72 y.o. male here today for follow up visit.   He reports that he is having exertional dyspnea.  Having some increased difficulty walking short distances, such as across a parking lot.  Continues on Trelegy for COPD.  He has not had chest pain,  palpitations, edema. He does continue to smoke about 2ppd.   BP is treated with losartan and hydrochlorothiazide.  Doing well with current strength of medication.  He denies chest pain, palpitations, headache or vision changes.  Tolerating atorvastatin for management of HLD  ROS:  A comprehensive ROS was completed and negative except as noted per HPI   No Known Allergies  Past Medical History:  Diagnosis Date   Diastolic dysfunction without heart failure 04/09/2015   HTN (hypertension) 03/27/2015   Prediabetes 03/29/2015   Stroke [facility])     Past Surgical History:  Procedure Laterality Date   APPENDECTOMY     HAND SURGERY     NOSE SURGERY     VASECTOMY      Social History   Socioeconomic History   Marital status: Divorced    Spouse name: Not on file   Number of children: Not on file   Years of education: Not on file   Highest education level: 12th grade  Occupational History   Not on file  Tobacco Use   Smoking status: Every Day    Current packs/day: 2.00    Average packs/day: 2.0 packs/day for 49.0 years (98.0 ttl pk-yrs)    Types: Cigarettes   Smokeless tobacco: Never   Tobacco comments:    1ppd currently  Substance and Sexual Activity   Alcohol use: No    Alcohol/week: 0.0 standard drinks of alcohol   Drug use: No   Sexual activity: Not on file  Other Topics Concern   Not on file  Social History Narrative   Not on file   Social Determinants of Health   Financial Resource Strain: Low Risk  (06/22/2022)   Overall Financial Resource  Strain (CARDIA)    Difficulty of Paying Living Expenses: Not hard at all  Food Insecurity: No Food Insecurity (06/22/2022)   Hunger Vital Sign    Worried About Running Out of Food in the Last Year: Never true    Ran Out of Food in the Last Year: Never true  Transportation Needs: No Transportation Needs (06/22/2022)   PRAPARE - Administrator, Civil Service (Medical): No    Lack of Transportation (Non-Medical): No  Physical Activity: Inactive (11/27/2022)   Exercise Vital Sign    Days of Exercise per Week: 0 days    Minutes of Exercise per Session: 0 min  Stress: No Stress Concern Present (06/22/2022)   [employer] of Occupational Health - Occupational Stress Questionnaire    Feeling of Stress : Not at all  Social Connections: Unknown (06/22/2022)   Social Connection and Isolation Panel [NHANES]    Frequency of Communication with Friends and Family: More than three times a week    Frequency of Social Gatherings with Friends and Family: More than three times a week    Attends Religious Services: Never    Database administrator or Organizations: Not on file    Attends Banker Meetings: Not on file    Marital Status: Divorced  Family History  Problem Relation Age of Onset   Diabetes Mother    Hypertension Mother    Hyperlipidemia Mother    Narcolepsy Mother    Stroke Mother    ALS Sister    Stroke Sister     Health Maintenance  Topic Date Due   Medicare Annual Wellness (AWV)  12/26/2022 (Originally 07/03/2018)   COVID-19 Vaccine (6 - 2023-24 season) 01/13/2023 (Originally 11/09/2022)   INFLUENZA VACCINE  06/08/2023 (Originally 10/09/2022)   Colonoscopy  11/27/2023 (Originally 08/01/2020)   Lung Cancer Screening  06/27/2023   DTaP/Tdap/Td (2 - Td or Tdap) 03/26/2025   Pneumonia Vaccine 75+ Years old  Completed   Hepatitis C Screening  Completed   Zoster Vaccines- Shingrix  Completed   HPV VACCINES  Aged Out      ----------------------------------------------------------------------------------------------------------------------------------------------------------------------------------------------------------------- Physical Exam BP (!) 146/77 (BP Location: Left Arm, Patient Position: Sitting, Cuff Size: Normal)   Pulse 66   Ht 5\' 6"  (1.676 m)   Wt 181 lb (82.1 kg)   SpO2 96%   BMI 29.21 kg/m   Physical Exam Constitutional:      Appearance: Normal appearance. He is well-developed.  HENT:     Head: Normocephalic and atraumatic.  Eyes:     General: No scleral icterus. Cardiovascular:     Rate and Rhythm: Normal rate and regular rhythm.  Pulmonary:     Effort: Pulmonary effort is normal.     Breath sounds: Wheezing present.  Musculoskeletal:     Cervical back: Neck supple.     Comments: Range of motion of left knee is normal.  No effusion noted.  Meniscal provocation testing is normal.  Neurological:     Mental Status: He is alert.  Psychiatric:        Mood and Affect: Mood normal.        Behavior: Behavior normal.     ------------------------------------------------------------------------------------------------------------------------------------------------------------------------------------------------------------------- Assessment and Plan  Prediabetes Blood sugars remain well-controlled.  Encouraged to continue working on dietary change.  History of CVA (cerebrovascular accident) Continue asa and statin.  He may reduce this to 81mg  daily.   HTN (hypertension) Blood pressure is a little elevated today.  He will monitor at home.  Low-sodium diet encouraged.  COPD (chronic obstructive pulmonary disease) (HCC) He is having some increased exertional dyspnea.  EKG without any new changes, likely COPD with current exacerbation.  Continue current inhalers.  Adding prednisone burst.  Chest x-ray ordered.  Smoking cessation encouraged.  Chronic pain of left knee X-rays  of the left knee ordered.   Meds ordered this encounter  Medications   predniSONE (DELTASONE) 20 MG tablet    Sig: Take 1 tablet (20 mg total) by mouth 2 (two) times daily with a meal for 5 days.    Dispense:  10 tablet    Refill:  0    Return in about 6 months (around 05/27/2023) for Hypertension/COPD.    This visit occurred during the SARS-CoV-2 public health emergency.  Safety protocols were in place, including screening questions prior to the visit, additional usage of staff PPE, and extensive cleaning of exam room while observing appropriate contact time as indicated for disinfecting solutions.

## 2022-11-27 NOTE — Assessment & Plan Note (Signed)
Continue asa and statin.  He may reduce this to 81mg  daily.

## 2022-11-27 NOTE — Assessment & Plan Note (Addendum)
Blood sugars remain well-controlled.  Encouraged to continue working on dietary change.

## 2022-12-02 ENCOUNTER — Other Ambulatory Visit: Payer: Self-pay | Admitting: Family Medicine

## 2022-12-08 ENCOUNTER — Encounter: Payer: Self-pay | Admitting: Family Medicine

## 2022-12-14 ENCOUNTER — Other Ambulatory Visit: Payer: Self-pay | Admitting: Family Medicine

## 2022-12-26 ENCOUNTER — Ambulatory Visit: Payer: PRIVATE HEALTH INSURANCE | Admitting: Family Medicine

## 2022-12-26 ENCOUNTER — Encounter: Payer: Self-pay | Admitting: Family Medicine

## 2023-03-02 ENCOUNTER — Telehealth: Payer: Self-pay | Admitting: Family Medicine

## 2023-03-02 NOTE — Telephone Encounter (Unsigned)
Copied from CRM [id number]. Topic: Clinical - Medical Advice >> Mar 02, 2023  9:51 AM Fuller Canada P wrote: Reason for CRM: Patient is requesting to speak with his provider over the phone as soon as possible. Call back number [phone].

## 2023-03-02 NOTE — Telephone Encounter (Signed)
Spoke with patient- asked patient if I could relay a question to the provider on his behalf.  He states he wants to speak directly with Dr. Ashley Royalty States he has a "personal question" for him

## 2023-03-05 ENCOUNTER — Encounter: Payer: Self-pay | Admitting: Family Medicine

## 2023-03-05 NOTE — Telephone Encounter (Signed)
Patient informed and letter placed in mail to patient.

## 2023-04-07 ENCOUNTER — Other Ambulatory Visit: Payer: Self-pay | Admitting: Family Medicine

## 2023-04-14 ENCOUNTER — Other Ambulatory Visit: Payer: Self-pay

## 2023-04-14 DIAGNOSIS — I119 Hypertensive heart disease without heart failure: Secondary | ICD-10-CM

## 2023-04-14 MED ORDER — ATORVASTATIN CALCIUM 40 MG PO TABS: 40.0000 mg | ORAL_TABLET | Freq: Every day | ORAL | 3 refills | Status: DC

## 2023-04-14 MED ORDER — LOSARTAN POTASSIUM 50 MG PO TABS: 50.0000 mg | ORAL_TABLET | Freq: Every day | ORAL | 1 refills | Status: DC

## 2023-04-15 ENCOUNTER — Encounter: Payer: Self-pay | Admitting: Family Medicine

## 2023-04-15 ENCOUNTER — Other Ambulatory Visit: Payer: Self-pay | Admitting: Family Medicine

## 2023-04-15 MED ORDER — ALBUTEROL SULFATE HFA 108 (90 BASE) MCG/ACT IN AERS: 2.0000 | INHALATION_SPRAY | Freq: Four times a day (QID) | RESPIRATORY_TRACT | 2 refills | Status: DC | PRN

## 2023-04-15 NOTE — Telephone Encounter (Signed)
 Requesting rx rf of Aluterol HFA Last written 11/25/2021 Last OV 11/27/2022 Upcoming appt=06/24/2023

## 2023-06-24 ENCOUNTER — Telehealth: Payer: Self-pay

## 2023-06-24 ENCOUNTER — Encounter (INDEPENDENT_AMBULATORY_CARE_PROVIDER_SITE_OTHER): Payer: Self-pay

## 2023-06-24 ENCOUNTER — Other Ambulatory Visit (HOSPITAL_COMMUNITY): Payer: Self-pay

## 2023-06-24 ENCOUNTER — Encounter: Payer: Self-pay | Admitting: Family Medicine

## 2023-06-24 ENCOUNTER — Ambulatory Visit (INDEPENDENT_AMBULATORY_CARE_PROVIDER_SITE_OTHER): Payer: PRIVATE HEALTH INSURANCE | Admitting: Family Medicine

## 2023-06-24 VITALS — BP 144/78 | HR 70 | Ht 66.0 in | Wt 186.0 lb

## 2023-06-24 DIAGNOSIS — H6123 Impacted cerumen, bilateral: Secondary | ICD-10-CM

## 2023-06-24 DIAGNOSIS — R7303 Prediabetes: Secondary | ICD-10-CM

## 2023-06-24 DIAGNOSIS — Z125 Encounter for screening for malignant neoplasm of prostate: Secondary | ICD-10-CM

## 2023-06-24 DIAGNOSIS — F17210 Nicotine dependence, cigarettes, uncomplicated: Secondary | ICD-10-CM | POA: Diagnosis not present

## 2023-06-24 DIAGNOSIS — J449 Chronic obstructive pulmonary disease, unspecified: Secondary | ICD-10-CM

## 2023-06-24 DIAGNOSIS — F17200 Nicotine dependence, unspecified, uncomplicated: Secondary | ICD-10-CM | POA: Diagnosis not present

## 2023-06-24 DIAGNOSIS — J441 Chronic obstructive pulmonary disease with (acute) exacerbation: Secondary | ICD-10-CM | POA: Insufficient documentation

## 2023-06-24 DIAGNOSIS — R911 Solitary pulmonary nodule: Secondary | ICD-10-CM | POA: Diagnosis not present

## 2023-06-24 DIAGNOSIS — I10 Essential (primary) hypertension: Secondary | ICD-10-CM | POA: Diagnosis not present

## 2023-06-24 DIAGNOSIS — E782 Mixed hyperlipidemia: Secondary | ICD-10-CM

## 2023-06-24 DIAGNOSIS — R3915 Urgency of urination: Secondary | ICD-10-CM

## 2023-06-24 MED ORDER — PREDNISONE 20 MG PO TABS: 20.0000 mg | ORAL_TABLET | Freq: Two times a day (BID) | ORAL | 0 refills | Status: AC

## 2023-06-24 MED ORDER — BREZTRI AEROSPHERE 160-9-4.8 MCG/ACT IN AERO: 2.0000 | INHALATION_SPRAY | Freq: Two times a day (BID) | RESPIRATORY_TRACT | 3 refills | Status: DC

## 2023-06-24 NOTE — Progress Notes (Signed)
 Jeremy Miller - 73 y.o. male MRN [id number]  Date of birth: [birth year]  Subjective Chief Complaint  Patient presents with   Hypertension    HPI Jeremy Miller is a 73 y.o. male here today for follow up.   Overall doing ok but continues to have exertional dyspnea.  He is prescribed trelegy and is using this regularly.  He does note more wheezing.  Still smoking about 2 packs/day.  Denies fever, chills or increased sputum production.Jeremy Miller  He denies chest pain or tightness.  Blood pressure has been pretty well-controlled.  Continues with losartan and hydrochlorothiazide.  Denies chest pain, palpitations, headache or vision changes.   Tolerating atorvastatin well at current strength.   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/09/2015   HTN (hypertension) 03/27/2015   Prediabetes 03/29/2015   Stroke [facility])     Past Surgical History:  Procedure Laterality Date   APPENDECTOMY     HAND SURGERY     NOSE SURGERY     VASECTOMY      Social History   Socioeconomic History   Marital status: Divorced    Spouse name: Not on file   Number of children: Not on file   Years of education: Not on file   Highest education level: 12th grade  Occupational History   Not on file  Tobacco Use   Smoking status: Every Day    Current packs/day: 2.00    Average packs/day: 2.0 packs/day for 49.0 years (98.0 ttl pk-yrs)    Types: Cigarettes   Smokeless tobacco: Never   Tobacco comments:    1ppd currently  Substance and Sexual Activity   Alcohol use: No    Alcohol/week: 0.0 standard drinks of alcohol   Drug use: No   Sexual activity: Not on file  Other Topics Concern   Not on file  Social History Narrative   Not on file   Social Drivers of Health   Financial Resource Strain: Low Risk  (06/23/2023)   Overall Financial Resource Strain (CARDIA)     Difficulty of Paying Living Expenses: Not hard at all  Food Insecurity: No Food Insecurity (06/23/2023)   Hunger Vital Sign    Worried About Running Out of Food in the Last Year: Never true    Ran Out of Food in the Last Year: Never true  Transportation Needs: No Transportation Needs (06/23/2023)   PRAPARE - Administrator, Civil Service (Medical): No    Lack of Transportation (Non-Medical): No  Physical Activity: Inactive (06/23/2023)   Exercise Vital Sign    Days of Exercise per Week: 0 days    Minutes of Exercise per Session: 0 min  Stress: No Stress Concern Present (06/23/2023)   [employer] of Occupational Health - Occupational Stress Questionnaire    Feeling of Stress : Not at all  Social Connections: Socially Isolated (06/23/2023)   Social Connection and Isolation Panel [NHANES]    Frequency of Communication with Friends and Family: More than three times a week    Frequency of Social Gatherings with Friends and Family: More than three times a week    Attends Religious Services: Never    Database administrator or Organizations: No    Attends Engineer, structural: Not on file    Marital Status: Divorced    Family History  Problem Relation  Age of Onset   Diabetes Mother    Hypertension Mother    Hyperlipidemia Mother    Narcolepsy Mother    Stroke Mother    ALS Sister    Stroke Sister     Health Maintenance  Topic Date Due   Medicare Annual Wellness (AWV)  07/03/2018   COVID-19 Vaccine (6 - 2024-25 season) 11/09/2022   Lung Cancer Screening  06/27/2023   Colonoscopy  11/27/2023 (Originally 08/01/2020)   INFLUENZA VACCINE  10/09/2023   DTaP/Tdap/Td (2 - Td or Tdap) 03/26/2025   Pneumonia Vaccine 66+ Years old  Completed   Hepatitis C Screening  Completed   Zoster Vaccines- Shingrix  Completed   HPV VACCINES  Aged Out   Meningococcal B Vaccine  Aged Out      ----------------------------------------------------------------------------------------------------------------------------------------------------------------------------------------------------------------- Physical Exam BP (!) 144/78 (BP Location: Left Arm, Patient Position: Sitting, Cuff Size: Normal)   Pulse 70   Ht 5\' 6"  (1.676 m)   Wt 186 lb (84.4 kg)   SpO2 97%   BMI 30.02 kg/m   Physical Exam Constitutional:      Appearance: Normal appearance.  HENT:     Ears:     Comments: Right cerumen impaction Eyes:     General: No scleral icterus. Cardiovascular:     Rate and Rhythm: Normal rate and regular rhythm.  Pulmonary:     Comments: Diffuse, bilateral wheezing. Musculoskeletal:     Cervical back: Neck supple.  Neurological:     General: No focal deficit present.     Mental Status: He is alert.  Psychiatric:        Mood and Affect: Mood normal.        Behavior: Behavior normal.     ------------------------------------------------------------------------------------------------------------------------------------------------------------------------------------------------------------------- Assessment and Plan  HTN (hypertension) Blood pressure is mildly elevated today.  Encouraged smoking cessation.  Will continue current medication for management of hypertension.  COPD exacerbation (HCC) He is having increased wheezing without increased sputum production at this time.  Adding burst of prednisone.  Will change Trelegy to Breztri to see if this is more effective for him compared to dry powder inhaler.  HLD (hyperlipidemia) Update lipid panel. Tolerating atorvastatin well at current strength.    Nicotine dependence Counseled on smoking cessation.  Updated low dose CT ordered.   Cerumen impaction Impacted cerumen.  Lavage completed today.  Tolerated well with improvement.     Meds ordered this encounter  Medications   predniSONE (DELTASONE) 20 MG tablet     Sig: Take 1 tablet (20 mg total) by mouth 2 (two) times daily with a meal for 5 days.    Dispense:  10 tablet    Refill:  0   budeson-glycopyrrolate-formoterol (BREZTRI AEROSPHERE) 160-9-4.8 MCG/ACT AERO inhaler    Sig: Inhale 2 puffs into the lungs in the morning and at bedtime.    Dispense:  10.7 g    Refill:  3    Return in about 6 months (around 12/24/2023) for Hypertension.

## 2023-06-24 NOTE — Patient Instructions (Signed)
 Try breztri to replace Trelegy.  Start prednisone 20mg  twice pre day for 5 days.  We'll be in touch with CT and blood work results.

## 2023-06-24 NOTE — Assessment & Plan Note (Signed)
Update lipid panel. Tolerating atorvastatin well at current strength.

## 2023-06-24 NOTE — Assessment & Plan Note (Signed)
Impacted cerumen.  Lavage completed today.  Tolerated well with improvement.   ?

## 2023-06-24 NOTE — Telephone Encounter (Signed)
 Pharmacy Patient Advocate Encounter  Received notification from [employer] that Prior Authorization for Breztri Aero Sphere has been APPROVED from 06/24/23 to 06/23/24. Ran test claim, Copay is $293.84 due to patient deductible. This test claim was processed through [facility]- copay amounts may vary at other pharmacies due to pharmacy/plan contracts, or as the patient moves through the different stages of their insurance plan.   PA #/Case ID/Reference #: Jeremy Miller

## 2023-06-24 NOTE — Assessment & Plan Note (Signed)
 He is having increased wheezing without increased sputum production at this time.  Adding burst of prednisone.  Will change Trelegy to Breztri to see if this is more effective for him compared to dry powder inhaler.

## 2023-06-24 NOTE — Telephone Encounter (Signed)
 Pharmacy Patient Advocate Encounter   Received notification from Patient Pharmacy that prior authorization for Breztri Aero Sphere is required/requested.   Insurance verification completed.   The patient is insured through [employer] .   Per test claim: PA required; PA submitted to above mentioned insurance via CoverMyMeds Key/confirmation #/[id number] Status is pending

## 2023-06-24 NOTE — Assessment & Plan Note (Signed)
 Blood pressure is mildly elevated today.  Encouraged smoking cessation.  Will continue current medication for management of hypertension.

## 2023-06-24 NOTE — Assessment & Plan Note (Signed)
Counseled on smoking cessation.  Updated low dose CT ordered.

## 2023-06-25 LAB — CBC WITH DIFFERENTIAL/PLATELET
Basophils Absolute: 0.1 10*3/uL (ref 0.0–0.2)
Basos: 1 %
EOS (ABSOLUTE): 0.1 10*3/uL (ref 0.0–0.4)
Eos: 1 %
Hematocrit: 44.3 % (ref 37.5–51.0)
Hemoglobin: 15 g/dL (ref 13.0–17.7)
Immature Grans (Abs): 0 10*3/uL (ref 0.0–0.1)
Immature Granulocytes: 0 %
Lymphocytes Absolute: 2.1 10*3/uL (ref 0.7–3.1)
Lymphs: 22 %
MCH: 30.1 pg (ref 26.6–33.0)
MCHC: 33.9 g/dL (ref 31.5–35.7)
MCV: 89 fL (ref 79–97)
Monocytes Absolute: 0.8 10*3/uL (ref 0.1–0.9)
Monocytes: 9 %
Neutrophils Absolute: 6.1 10*3/uL (ref 1.4–7.0)
Neutrophils: 67 %
Platelets: 379 10*3/uL (ref 150–450)
RBC: 4.99 x10E6/uL (ref 4.14–5.80)
RDW: 13 % (ref 11.6–15.4)
WBC: 9.2 10*3/uL (ref 3.4–10.8)

## 2023-06-25 LAB — CMP14+EGFR
ALT: 17 IU/L (ref 0–44)
AST: 18 IU/L (ref 0–40)
Albumin: 4.5 g/dL (ref 3.8–4.8)
Alkaline Phosphatase: 109 IU/L (ref 44–121)
BUN/Creatinine Ratio: 11 (ref 10–24)
BUN: 12 mg/dL (ref 8–27)
Bilirubin Total: 0.4 mg/dL (ref 0.0–1.2)
CO2: 25 mmol/L (ref 20–29)
Calcium: 10.1 mg/dL (ref 8.6–10.2)
Chloride: 99 mmol/L (ref 96–106)
Creatinine, Ser: 1.06 mg/dL (ref 0.76–1.27)
Globulin, Total: 2.4 g/dL (ref 1.5–4.5)
Glucose: 110 mg/dL — ABNORMAL HIGH (ref 70–99)
Potassium: 4.1 mmol/L (ref 3.5–5.2)
Sodium: 139 mmol/L (ref 134–144)
Total Protein: 6.9 g/dL (ref 6.0–8.5)
eGFR: 74 mL/min/{1.73_m2} (ref >59)

## 2023-06-25 LAB — LIPID PANEL WITH LDL/HDL RATIO
Cholesterol, Total: 148 mg/dL (ref 100–199)
HDL: 46 mg/dL (ref >39)
LDL Chol Calc (NIH): 74 mg/dL (ref 0–99)
LDL/HDL Ratio: 1.6 ratio (ref 0.0–3.6)
Triglycerides: 163 mg/dL — ABNORMAL HIGH (ref 0–149)
VLDL Cholesterol Cal: 28 mg/dL (ref 5–40)

## 2023-06-25 LAB — HEMOGLOBIN A1C
Est. average glucose Bld gHb Est-mCnc: 140 mg/dL
Hgb A1c MFr Bld: 6.5 % — ABNORMAL HIGH (ref 4.8–5.6)

## 2023-06-25 LAB — PSA: Prostate Specific Ag, Serum: 1.4 ng/mL (ref 0.0–4.0)

## 2023-07-08 ENCOUNTER — Other Ambulatory Visit: Payer: Self-pay | Admitting: Family Medicine

## 2023-07-10 ENCOUNTER — Encounter: Payer: Self-pay | Admitting: Family Medicine

## 2023-07-17 ENCOUNTER — Ambulatory Visit: Payer: PRIVATE HEALTH INSURANCE

## 2023-07-17 DIAGNOSIS — F17210 Nicotine dependence, cigarettes, uncomplicated: Secondary | ICD-10-CM | POA: Diagnosis not present

## 2023-07-17 DIAGNOSIS — Z122 Encounter for screening for malignant neoplasm of respiratory organs: Secondary | ICD-10-CM

## 2023-07-17 DIAGNOSIS — R911 Solitary pulmonary nodule: Secondary | ICD-10-CM

## 2023-07-17 DIAGNOSIS — F17200 Nicotine dependence, unspecified, uncomplicated: Secondary | ICD-10-CM

## 2023-07-20 MED ORDER — TRELEGY ELLIPTA 200-62.5-25 MCG/ACT IN AEPB: INHALATION_SPRAY | RESPIRATORY_TRACT | 3 refills | Status: DC

## 2023-07-20 NOTE — Telephone Encounter (Signed)

## 2023-07-20 NOTE — Telephone Encounter (Signed)
 Patient states that he did not see much difference with the Breztri  sample given( and states the sample did not last the full 30 days taken as 2 inhalations twice daily )  vs. Trelegy.  He states that Trelegy would be cheaper as 90 day supply.  Should he just stay with Trelelgy ? Or what are your recommendations?

## 2023-07-30 ENCOUNTER — Encounter: Payer: Self-pay | Admitting: Family Medicine

## 2023-08-04 ENCOUNTER — Ambulatory Visit: Payer: Self-pay | Admitting: Family Medicine

## 2023-08-04 DIAGNOSIS — E041 Nontoxic single thyroid nodule: Secondary | ICD-10-CM

## 2023-08-28 ENCOUNTER — Other Ambulatory Visit: Payer: Self-pay | Admitting: Family Medicine

## 2023-09-24 ENCOUNTER — Other Ambulatory Visit

## 2023-09-24 DIAGNOSIS — E041 Nontoxic single thyroid nodule: Secondary | ICD-10-CM

## 2023-10-08 ENCOUNTER — Ambulatory Visit: Payer: Self-pay | Admitting: Family Medicine

## 2023-10-08 DIAGNOSIS — E041 Nontoxic single thyroid nodule: Secondary | ICD-10-CM | POA: Insufficient documentation

## 2023-10-08 NOTE — Progress Notes (Signed)
 Hi Jeremy Miller, I am covering for Dr. Alvia while he is out of the office.  I wanted to give you an update on your ultrasound of your thyroid  gland.  I know they were investigating a nodule they had they had seen on your CAT scan of your chest.  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  It did not have any concerning features which is very reassuring but they did recommend that we monitor it carefully and plan to redo an ultrasound in 1 year.

## 2023-10-10 ENCOUNTER — Other Ambulatory Visit: Payer: Self-pay | Admitting: Family Medicine

## 2023-11-27 ENCOUNTER — Ambulatory Visit

## 2023-11-27 ENCOUNTER — Encounter: Payer: Self-pay | Admitting: Family Medicine

## 2023-11-27 ENCOUNTER — Ambulatory Visit (INDEPENDENT_AMBULATORY_CARE_PROVIDER_SITE_OTHER): Payer: PRIVATE HEALTH INSURANCE | Admitting: Family Medicine

## 2023-11-27 ENCOUNTER — Ambulatory Visit: Payer: Self-pay | Admitting: Family Medicine

## 2023-11-27 VITALS — BP 115/69 | HR 75 | Ht 66.0 in | Wt 186.0 lb

## 2023-11-27 DIAGNOSIS — R609 Edema, unspecified: Secondary | ICD-10-CM

## 2023-11-27 DIAGNOSIS — R0609 Other forms of dyspnea: Secondary | ICD-10-CM

## 2023-11-27 DIAGNOSIS — I10 Essential (primary) hypertension: Secondary | ICD-10-CM

## 2023-11-27 DIAGNOSIS — I2089 Other forms of angina pectoris: Secondary | ICD-10-CM | POA: Diagnosis not present

## 2023-11-27 DIAGNOSIS — E782 Mixed hyperlipidemia: Secondary | ICD-10-CM

## 2023-11-27 DIAGNOSIS — F17210 Nicotine dependence, cigarettes, uncomplicated: Secondary | ICD-10-CM

## 2023-11-27 DIAGNOSIS — J449 Chronic obstructive pulmonary disease, unspecified: Secondary | ICD-10-CM

## 2023-11-27 NOTE — Progress Notes (Signed)
 6-minute Walk:   96%; 82bpm 92%; 117bpm 93%; 122bpm 92%; 127bpm 91%; 121bpm 97%; 86bpm

## 2023-11-27 NOTE — Progress Notes (Signed)
 Jeremy Miller - 73 y.o. male MRN [id number]  Date of birth: Oct 11, 1950  Subjective Chief Complaint  Patient presents with   Chest Pain   Hypertension    HPI Jeremy Miller is a 73 y.o. male here today for follow up visit.   He reports that he is doing ok.  He continues on losartan  and hydrochlorothiazide  for management of HTN. BP is well controlled.   He has not had side effects from medications.  He is having some episodic chest pain and exertional dyspnea.  Has trouble walking a few hundred feet before needing to stop.    He is tolerating atorvastatin  for management of HLD.   He remains on trelegy daily with albuterol  PRN for COPD.  He does continue to smoke.  Numerous attempts to quit have been unsuccessful.    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/09/2015   HTN (hypertension) 03/27/2015   Prediabetes 03/29/2015   Stroke [facility])     Past Surgical History:  Procedure Laterality Date   APPENDECTOMY     HAND SURGERY     NOSE SURGERY     VASECTOMY      Social History   Socioeconomic History   Marital status: Divorced    Spouse name: Not on file   Number of children: Not on file   Years of education: Not on file   Highest education level: 12th grade  Occupational History   Not on file  Tobacco Use   Smoking status: Every Day    Current packs/day: 2.00    Average packs/day: 2.0 packs/day for 49.0 years (98.0 ttl pk-yrs)    Types: Cigarettes   Smokeless tobacco: Never   Tobacco comments:    1ppd currently  Substance and Sexual Activity   Alcohol use: No    Alcohol/week: 0.0 standard drinks of alcohol   Drug use: No   Sexual activity: Not on file  Other Topics Concern   Not on file  Social History Narrative   Not on file   Social Drivers of Health   Financial Resource Strain: Low Risk  (11/23/2023)    Overall Financial Resource Strain (CARDIA)    Difficulty of Paying Living Expenses: Not hard at all  Food Insecurity: No Food Insecurity (11/23/2023)   Hunger Vital Sign    Worried About Running Out of Food in the Last Year: Never true    Ran Out of Food in the Last Year: Never true  Transportation Needs: No Transportation Needs (11/23/2023)   PRAPARE - Administrator, Civil Service (Medical): No    Lack of Transportation (Non-Medical): No  Physical Activity: Insufficiently Active (11/23/2023)   Exercise Vital Sign    Days of Exercise per Week: 2 days    Minutes of Exercise per Session: 30 min  Stress: No Stress Concern Present (11/23/2023)   [employer] of Occupational Health - Occupational Stress Questionnaire    Feeling of Stress: Not at all  Social Connections: Socially Isolated (11/23/2023)   Social Connection and Isolation Panel    Frequency of Communication with Friends and Family: More than three times a week    Frequency of Social Gatherings with Friends and Family: Three times a week    Attends Religious Services: Never    Active Member of Clubs or Organizations: No    Attends Club or  Organization Meetings: Not on file    Marital Status: Divorced    Family History  Problem Relation Age of Onset   Diabetes Mother    Hypertension Mother    Hyperlipidemia Mother    Narcolepsy Mother    Stroke Mother    ALS Sister    Stroke Sister     Health Maintenance  Topic Date Due   Medicare Annual Wellness (AWV)  07/03/2018   Colonoscopy  08/01/2020   COVID-19 Vaccine (9 - 2024-25 season) 12/13/2023 (Originally 11/09/2023)   Influenza Vaccine  06/07/2024 (Originally 10/09/2023)   Lung Cancer Screening  07/16/2024   DTaP/Tdap/Td (2 - Td or Tdap) 03/26/2025   Pneumococcal Vaccine: 50+ Years  Completed   Hepatitis C Screening  Completed   Zoster Vaccines- Shingrix  Completed   HPV VACCINES  Aged Out   Meningococcal B Vaccine  Aged Out      ----------------------------------------------------------------------------------------------------------------------------------------------------------------------------------------------------------------- Physical Exam BP 115/69 (BP Location: Left Arm, Patient Position: Sitting, Cuff Size: Normal)   Pulse 75   Ht 5' 6 (1.676 m)   Wt 186 lb (84.4 kg)   SpO2 97%   BMI 30.02 kg/m   Physical Exam Constitutional:      Appearance: Normal appearance.  Eyes:     General: No scleral icterus. Cardiovascular:     Rate and Rhythm: Normal rate and regular rhythm.  Pulmonary:     Effort: Pulmonary effort is normal.     Breath sounds: Normal breath sounds.  Neurological:     General: No focal deficit present.     Mental Status: He is alert.  Psychiatric:        Mood and Affect: Mood normal.        Behavior: Behavior normal.    EKG: normal EKG, normal sinus rhythm, unchanged from previous tracings, RBBB, left axis deviation.  ------------------------------------------------------------------------------------------------------------------------------------------------------------------------------------------------------------------- Assessment and Plan  HTN (hypertension) Blood pressure is well-controlled today.  He will continue current medications for management of hypertension.  HLD (hyperlipidemia) Tolerating atorvastatin  well at current strength.  Will plan to continue.  Nicotine dependence Counseled on smoking cessation.  Dyspnea on exertion He is having some exertional dyspnea as well as chest tightness associated with this.  This could be related to his COPD however I do have concern for stable angina.  EKG without changes from last year.  Will place referral to cardiology.  He does have nitroglycerin  which I encouraged him to try if he does have chest pain.  COPD (chronic obstructive pulmonary disease) (HCC) Continue current inhalers.  Reiterated smoking  cessation.   No orders of the defined types were placed in this encounter.   Return in about 6 months (around 05/26/2024) for Hypertension, COPD.

## 2023-11-29 DIAGNOSIS — R0609 Other forms of dyspnea: Secondary | ICD-10-CM | POA: Insufficient documentation

## 2023-11-29 LAB — CMP14+EGFR
ALT: 19 IU/L (ref 0–44)
AST: 21 IU/L (ref 0–40)
Albumin: 4.2 g/dL (ref 3.8–4.8)
Alkaline Phosphatase: 113 IU/L (ref 47–123)
BUN/Creatinine Ratio: 12 (ref 10–24)
BUN: 14 mg/dL (ref 8–27)
Bilirubin Total: 0.4 mg/dL (ref 0.0–1.2)
CO2: 23 mmol/L (ref 20–29)
Calcium: 9.4 mg/dL (ref 8.6–10.2)
Chloride: 99 mmol/L (ref 96–106)
Creatinine, Ser: 1.13 mg/dL (ref 0.76–1.27)
Globulin, Total: 2.6 g/dL (ref 1.5–4.5)
Glucose: 90 mg/dL (ref 70–99)
Potassium: 4.6 mmol/L (ref 3.5–5.2)
Sodium: 140 mmol/L (ref 134–144)
Total Protein: 6.8 g/dL (ref 6.0–8.5)
eGFR: 69 mL/min/1.73 (ref >59)

## 2023-11-29 LAB — CBC WITH DIFFERENTIAL/PLATELET
Basophils Absolute: 0.1 x10E3/uL (ref 0.0–0.2)
Basos: 1 %
EOS (ABSOLUTE): 0.1 x10E3/uL (ref 0.0–0.4)
Eos: 1 %
Hematocrit: 44.8 % (ref 37.5–51.0)
Hemoglobin: 14.8 g/dL (ref 13.0–17.7)
Immature Grans (Abs): 0 x10E3/uL (ref 0.0–0.1)
Immature Granulocytes: 0 %
Lymphocytes Absolute: 2.1 x10E3/uL (ref 0.7–3.1)
Lymphs: 25 %
MCH: 29.5 pg (ref 26.6–33.0)
MCHC: 33 g/dL (ref 31.5–35.7)
MCV: 89 fL (ref 79–97)
Monocytes Absolute: 0.8 x10E3/uL (ref 0.1–0.9)
Monocytes: 10 %
Neutrophils Absolute: 5.5 x10E3/uL (ref 1.4–7.0)
Neutrophils: 63 %
Platelets: 384 x10E3/uL (ref 150–450)
RBC: 5.01 x10E6/uL (ref 4.14–5.80)
RDW: 12.6 % (ref 11.6–15.4)
WBC: 8.6 x10E3/uL (ref 3.4–10.8)

## 2023-11-29 LAB — BRAIN NATRIURETIC PEPTIDE: BNP: 18.5 pg/mL (ref 0.0–100.0)

## 2023-11-29 NOTE — Assessment & Plan Note (Signed)
 Continue current inhalers.  Reiterated smoking cessation.

## 2023-11-29 NOTE — Assessment & Plan Note (Signed)
Tolerating atorvastatin well at current strength.  Will plan to continue.

## 2023-11-29 NOTE — Assessment & Plan Note (Addendum)
 He is having some exertional dyspnea as well as chest tightness associated with this.  This could be related to his COPD however I do have concern for stable angina.  EKG without changes from last year.  Will place referral to cardiology.  He does have nitroglycerin  which I encouraged him to try if he does have chest pain.

## 2023-11-29 NOTE — Assessment & Plan Note (Addendum)
 Blood pressure is well-controlled today.  He will continue current medications for management of hypertension.

## 2023-11-29 NOTE — Assessment & Plan Note (Signed)
 Counseled on smoking cessation

## 2023-12-14 NOTE — Addendum Note (Signed)
 Addended by: BONNY JON DEL on: 12/14/2023 11:12 AM   Modules accepted: Orders

## 2023-12-25 ENCOUNTER — Ambulatory Visit: Payer: PRIVATE HEALTH INSURANCE | Admitting: Family Medicine

## 2024-01-11 ENCOUNTER — Encounter: Payer: Self-pay | Admitting: Family Medicine

## 2024-01-15 ENCOUNTER — Other Ambulatory Visit: Payer: Self-pay

## 2024-01-15 DIAGNOSIS — J441 Chronic obstructive pulmonary disease with (acute) exacerbation: Secondary | ICD-10-CM

## 2024-01-15 DIAGNOSIS — R0609 Other forms of dyspnea: Secondary | ICD-10-CM

## 2024-01-15 NOTE — Telephone Encounter (Signed)
 Contacted the pharmacy concerning pricing for Symbicort . Waiting for response.

## 2024-01-17 MED ORDER — BUDESONIDE-FORMOTEROL FUMARATE 160-4.5 MCG/ACT IN AERO: 2.0000 | INHALATION_SPRAY | Freq: Two times a day (BID) | RESPIRATORY_TRACT | 12 refills | Status: AC

## 2024-02-08 ENCOUNTER — Encounter: Payer: Self-pay | Admitting: Family Medicine

## 2024-02-08 MED ORDER — ALBUTEROL SULFATE HFA 108 (90 BASE) MCG/ACT IN AERS: 1.0000 | INHALATION_SPRAY | Freq: Four times a day (QID) | RESPIRATORY_TRACT | 2 refills | Status: AC | PRN

## 2024-03-11 NOTE — Progress Notes (Signed)
 "    Referring-Cody Alvia, DO Reason for referral-dyspnea  HPI: 74 year old male for evaluation of dyspnea at request of Velma Alvia, DO.  Nuclear study 2017 showed ejection fraction 53% and no ischemia.  Monitor 2017 showed no significant arrhythmias.  Echocardiogram 2017 showed normal LV function and grade 1 diastolic dysfunction.  Carotid Dopplers April 2017 showed no hemodynamically significant stenosis, thyroid  nodules noted (followed by primary care).  Laboratory September 2025 showed creatinine 1.13, potassium 4.6, BNP 18.5, and hemoglobin 14.8.  Recently complained of worsening dyspnea and cardiology asked to evaluate.  Note patient does have COPD.  Patient has dyspnea on exertion that has been progressive.  Worse recently.  No orthopnea, PND, increased pedal edema, chest pain or syncope.  Current Outpatient Medications  Medication Sig Dispense Refill   albuterol  (VENTOLIN  HFA) 108 (90 Base) MCG/ACT inhaler Inhale 1-2 puffs into the lungs every 6 (six) hours as needed for wheezing or shortness of breath. 18 g 2   aspirin  325 MG tablet Take 1 tablet (325 mg total) by mouth daily. 90 tablet 3   atorvastatin  (LIPITOR) 40 MG tablet Take 1 tablet (40 mg total) by mouth daily. 90 tablet 3   budesonide -formoterol  (BREYNA ) 160-4.5 MCG/ACT inhaler Inhale 2 puffs into the lungs 2 (two) times daily. 30.9 each 12   diclofenac  sodium (VOLTAREN ) 1 % GEL Apply 2 g topically 4 (four) times daily. To affected joint. 100 g 11   hydrochlorothiazide  (HYDRODIURIL ) 25 MG tablet TAKE 1 TABLET(25 MG) BY MOUTH DAILY 90 tablet 3   ketorolac (ACULAR) 0.5 % ophthalmic solution 1 drop 4 (four) times daily.     losartan  (COZAAR ) 50 MG tablet TAKE 1 TABLET(50 MG) BY MOUTH DAILY 90 tablet 1   nitroGLYCERIN  (NITROSTAT ) 0.4 MG SL tablet DISSOLVE ONE TABLET UNDER TONGUE AS NEEDED FOR CHEST PAIN EVERY 5 MINUTES FOR 3 DOSES 25 tablet 1   ofloxacin (OCUFLOX) 0.3 % ophthalmic solution 1 drop 4 (four) times daily.      omeprazole  (PRILOSEC) 40 MG capsule TAKE 1 CAPSULE(40 MG) BY MOUTH DAILY 90 capsule 3   prednisoLONE acetate (PRED FORTE) 1 % ophthalmic suspension 1 drop 4 (four) times daily.     TRELEGY ELLIPTA  200-62.5-25 MCG/ACT AEPB Take 1 puff by mouth daily.     No current facility-administered medications for this visit.    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/09/2015   HTN (hypertension) 03/27/2015   Prediabetes 03/29/2015   Stroke (HCC)    Thyroid  nodule     Past Surgical History:  Procedure Laterality Date   APPENDECTOMY     HAND SURGERY     NOSE SURGERY     VASECTOMY      Social History   Socioeconomic History   Marital status: Divorced    Spouse name: Not on file   Number of children: 3   Years of education: Not on file   Highest education level: 12th grade  Occupational History   Not on file  Tobacco Use   Smoking status: Every Day    Current packs/day: 2.00    Average packs/day: 2.0 packs/day for 49.0 years (98.0 ttl pk-yrs)    Types: Cigarettes   Smokeless tobacco: Never   Tobacco comments:    03/21/2024 Patient smokes about 1/2 pack daily    1ppd currently  Substance and Sexual Activity   Alcohol use: No    Alcohol/week:  0.0 standard drinks of alcohol   Drug use: No   Sexual activity: Not on file  Other Topics Concern   Not on file  Social History Narrative   Not on file   Social Drivers of Health   Tobacco Use: High Risk (03/21/2024)   Patient History    Smoking Tobacco Use: Every Day    Smokeless Tobacco Use: Never    Passive Exposure: Not on file  Financial Resource Strain: Low Risk (11/23/2023)   Overall Financial Resource Strain (CARDIA)    Difficulty of Paying Living Expenses: Not hard at all  Food Insecurity: No Food Insecurity (11/23/2023)   Epic    Worried About Programme Researcher, Broadcasting/film/video in the Last Year: Never true    Ran Out of  Food in the Last Year: Never true  Transportation Needs: No Transportation Needs (11/23/2023)   Epic    Lack of Transportation (Medical): No    Lack of Transportation (Non-Medical): No  Physical Activity: Insufficiently Active (11/23/2023)   Exercise Vital Sign    Days of Exercise per Week: 2 days    Minutes of Exercise per Session: 30 min  Stress: No Stress Concern Present (11/23/2023)   [employer] of Occupational Health - Occupational Stress Questionnaire    Feeling of Stress: Not at all  Social Connections: Socially Isolated (11/23/2023)   Social Connection and Isolation Panel    Frequency of Communication with Friends and Family: More than three times a week    Frequency of Social Gatherings with Friends and Family: Three times a week    Attends Religious Services: Never    Active Member of Clubs or Organizations: No    Attends Banker Meetings: Not on file    Marital Status: Divorced  Intimate Partner Violence: Not on file  Depression (PHQ2-9): Low Risk (11/27/2023)   Depression (PHQ2-9)    PHQ-2 Score: 0  Alcohol Screen: Not on file  Housing: Low Risk (11/23/2023)   Epic    Unable to Pay for Housing in the Last Year: No    Number of Times Moved in the Last Year: 0    Homeless in the Last Year: No  Utilities: Not on file  Health Literacy: Not on file    Family History  Problem Relation Age of Onset   Diabetes Mother    Hypertension Mother    Hyperlipidemia Mother    Narcolepsy Mother    Stroke Mother    ALS Sister    Stroke Sister     ROS: no fevers or chills, productive cough, hemoptysis, dysphasia, odynophagia, melena, hematochezia, dysuria, hematuria, rash, seizure activity, orthopnea, PND, pedal edema, claudication. Remaining systems are negative.  Physical Exam:   Blood pressure 115/72, pulse 70, height 5' 6 (1.676 m), weight 179 lb 12.8 oz (81.6 kg), SpO2 96%.  General:  Well developed/well nourished in NAD Skin warm/dry Patient not  depressed No peripheral clubbing Back-normal HEENT-normal/normal eyelids Neck supple/normal carotid upstroke bilaterally; no bruits; no JVD; no thyromegaly chest - CTA/ normal expansion CV - RRR/normal S1 and S2; no murmurs, rubs or gallops;  PMI nondisplaced Abdomen -NT/ND, no HSM, no mass, + bowel sounds, positive bruit 2+ femoral pulses, no bruits Ext-no edema, chords, 2+ DP Neuro-grossly nonfocal  EKG Interpretation Date/Time:  Monday March 21 2024 09:43:51 EST Ventricular Rate:  70 PR Interval:  128 QRS Duration:  114 QT Interval:  428 QTC Calculation: 462 R Axis:   267  Text Interpretation: Normal sinus rhythm Left axis deviation  Right bundle branch block No previous ECGs available Confirmed by Pietro Rogue ([id number]) on 03/21/2024 9:45:18 AM    A/P  1 Dyspnea-likely secondary to COPD.  Will arrange echocardiogram to assess LV function and coronary CTA to rule out obstructive coronary disease.  2 hypertension-blood pressure controlled.  Continue losartan  at present dose.  3 tobacco abuse-patient counseled on discontinuing but states he has no interest in quitting.  4 hyperlipidemia-continue statin.  5 bruit-schedule abdominal ultrasound to rule out aneurysm.  Rogue Pietro, MD     [1] No Known Allergies  "

## 2024-03-21 ENCOUNTER — Ambulatory Visit (INDEPENDENT_AMBULATORY_CARE_PROVIDER_SITE_OTHER): Payer: PRIVATE HEALTH INSURANCE | Admitting: Cardiology

## 2024-03-21 ENCOUNTER — Encounter: Payer: Self-pay | Admitting: Cardiology

## 2024-03-21 VITALS — BP 115/72 | HR 70 | Ht 66.0 in | Wt 179.8 lb

## 2024-03-21 DIAGNOSIS — Z136 Encounter for screening for cardiovascular disorders: Secondary | ICD-10-CM | POA: Diagnosis not present

## 2024-03-21 DIAGNOSIS — R0609 Other forms of dyspnea: Secondary | ICD-10-CM

## 2024-03-21 DIAGNOSIS — R072 Precordial pain: Secondary | ICD-10-CM | POA: Diagnosis not present

## 2024-03-21 DIAGNOSIS — Z720 Tobacco use: Secondary | ICD-10-CM

## 2024-03-21 DIAGNOSIS — I10 Essential (primary) hypertension: Secondary | ICD-10-CM | POA: Diagnosis not present

## 2024-03-21 MED ORDER — METOPROLOL TARTRATE 100 MG PO TABS: ORAL_TABLET | ORAL | 0 refills | Status: AC

## 2024-03-21 NOTE — Patient Instructions (Addendum)
 "  Testing/Procedures:  Your physician has requested that you have an echocardiogram. Echocardiography is a painless test that uses sound waves to create images of your heart. It provides your doctor with information about the size and shape of your heart and how well your hearts chambers and valves are working. This procedure takes approximately one hour. There are no restrictions for this procedure. Please do NOT wear cologne, perfume, aftershave, or lotions (deodorant is allowed). Please arrive 15 minutes prior to your appointment time.  Please note: We ask at that you not bring children with you during ultrasound (echo/ vascular) testing. Due to room size and safety concerns, children are not allowed in the ultrasound rooms during exams. Our front office staff cannot provide observation of children in our lobby area while testing is being conducted. An adult accompanying a patient to their appointment will only be allowed in the ultrasound room at the discretion of the ultrasound technician under special circumstances. We apologize for any inconvenience. [facility] 1 ST FLOOR IMAGING DEPARTMENT  Your physician has requested that you have an abdominal aorta duplex. During this test, an ultrasound is used to evaluate the aorta. Allow 30 minutes for this exam. Do not eat after midnight the day before and avoid carbonated beverages.  Please note: We ask at that you not bring children with you during ultrasound (echo/ vascular) testing. Due to room size and safety concerns, children are not allowed in the ultrasound rooms during exams. Our front office staff cannot provide observation of children in our lobby area while testing is being conducted. An adult accompanying a patient to their appointment will only be allowed in the ultrasound room at the discretion of the ultrasound technician under special circumstances. We apologize for any inconvenience. [facility] 1 ST FLOOR IMAGING  DEPARTMENT  Follow-Up: At [facility], you and your health needs are our priority.  As part of our continuing mission to provide you with exceptional heart care, our providers are all part of one team.  This team includes your primary Cardiologist (physician) and Advanced Practice Providers or APPs (Physician Assistants and Nurse Practitioners) who all work together to provide you with the care you need, when you need it.  Your next appointment:   12 month(s)  Provider:   REDELL SHALLOW MD   Other Instructions    Your cardiac CT will be scheduled at   [facility] [address] [place], [place] [address] [phone]    If scheduled at [facility], please arrive 30 minutes early for check-in and test prep.  Please follow these instructions carefully (unless otherwise directed):  An IV will be required for this test and Nitroglycerin  will be given.  Hold all erectile dysfunction medications at least 3 days (72 hrs) prior to test. (Ie viagra, cialis, sildenafil, tadalafil, etc)   On the Night Before the Test: Be sure to Drink plenty of water. Do not consume any caffeinated/decaffeinated beverages or chocolate 12 hours prior to your test. Do not take any antihistamines 12 hours prior to your test.   On the Day of the Test: Drink plenty of water until 1 hour prior to the test. Do not eat any food 1 hour prior to test. You may take your regular medications prior to the test.  Take metoprolol  (Lopressor ) 100 MG two hours prior to test. If you take Hydrochlorothiazide  please HOLD on the morning of the test.      After the Test:  Drink plenty of water. After receiving IV contrast, you may experience a mild flushed feeling. This is normal. On occasion, you may experience a mild rash up to 24 hours after the test. This is not dangerous. If this occurs, you can take Benadryl 25 mg, Zyrtec, Claritin, or Allegra and increase your fluid intake.  (Patients taking Tikosyn should avoid Benadryl, and may take Zyrtec, Claritin, or Allegra) If you experience trouble breathing, this can be serious. If it is severe call [phone] IMMEDIATELY. If it is mild, please call our office.  We will call to schedule your test 2-4 weeks out understanding that some insurance companies will need an authorization prior to the service being performed.   For more information and frequently asked questions, please visit our website : [email or web address]  For non-scheduling related questions, please contact the cardiac imaging nurse navigator should you have any questions/concerns: Cardiac Imaging Nurse Navigators Direct Office Dial: [phone]   For scheduling needs, including cancellations and rescheduling, please call Brittany, [phone].          "

## 2024-03-25 ENCOUNTER — Other Ambulatory Visit: Payer: Self-pay

## 2024-03-25 DIAGNOSIS — J449 Chronic obstructive pulmonary disease, unspecified: Secondary | ICD-10-CM

## 2024-03-25 MED ORDER — TRELEGY ELLIPTA 200-62.5-25 MCG/ACT IN AEPB: 1.0000 | INHALATION_SPRAY | Freq: Every day | RESPIRATORY_TRACT | 2 refills | Status: AC

## 2024-03-29 ENCOUNTER — Other Ambulatory Visit: Payer: Self-pay | Admitting: Cardiology

## 2024-04-06 ENCOUNTER — Other Ambulatory Visit: Payer: Self-pay | Admitting: Family Medicine

## 2024-04-06 DIAGNOSIS — I119 Hypertensive heart disease without heart failure: Secondary | ICD-10-CM

## 2024-04-06 NOTE — Telephone Encounter (Signed)
 Pt called to report that his current supply of medication is extremely low.

## 2024-04-27 ENCOUNTER — Ambulatory Visit (HOSPITAL_BASED_OUTPATIENT_CLINIC_OR_DEPARTMENT_OTHER): Payer: PRIVATE HEALTH INSURANCE

## 2024-05-24 ENCOUNTER — Other Ambulatory Visit (HOSPITAL_BASED_OUTPATIENT_CLINIC_OR_DEPARTMENT_OTHER): Payer: PRIVATE HEALTH INSURANCE

## 2024-06-30 ENCOUNTER — Ambulatory Visit: Payer: PRIVATE HEALTH INSURANCE | Admitting: Family Medicine
